# Patient Record
Sex: Female | Born: 1965 | Race: White | Hispanic: No | Marital: Single | Smoking: Never smoker
Health system: Southern US, Community
[De-identification: ages and names within clinical notes are randomized; demographics above are authoritative.]

## PROBLEM LIST (undated history)

## (undated) DIAGNOSIS — C50919 Malignant neoplasm of unspecified site of unspecified female breast: Secondary | ICD-10-CM

## (undated) DIAGNOSIS — D649 Anemia, unspecified: Secondary | ICD-10-CM

## (undated) DIAGNOSIS — T7840XA Allergy, unspecified, initial encounter: Secondary | ICD-10-CM

## (undated) DIAGNOSIS — S41119A Laceration without foreign body of unspecified upper arm, initial encounter: Secondary | ICD-10-CM

## (undated) HISTORY — DX: Allergy, unspecified, initial encounter: T78.40XA

## (undated) HISTORY — DX: Anemia, unspecified: D64.9

## (undated) HISTORY — PX: BREAST LUMPECTOMY: SHX2

## (undated) HISTORY — DX: Laceration without foreign body of unspecified upper arm, initial encounter: S41.119A

## (undated) HISTORY — DX: Malignant neoplasm of unspecified site of unspecified female breast: C50.919

---

## 1997-12-19 ENCOUNTER — Other Ambulatory Visit: Admission: RE | Admit: 1997-12-19 | Discharge: 1997-12-19 | Payer: Self-pay | Admitting: Obstetrics and Gynecology

## 2001-11-09 ENCOUNTER — Other Ambulatory Visit: Admission: RE | Admit: 2001-11-09 | Discharge: 2001-11-09 | Payer: Self-pay | Admitting: Obstetrics and Gynecology

## 2006-05-25 HISTORY — PX: WISDOM TOOTH EXTRACTION: SHX21

## 2007-11-21 ENCOUNTER — Other Ambulatory Visit: Admission: RE | Admit: 2007-11-21 | Discharge: 2007-11-21 | Payer: Self-pay | Admitting: Obstetrics and Gynecology

## 2013-11-17 ENCOUNTER — Encounter: Payer: Self-pay | Admitting: Certified Nurse Midwife

## 2013-11-20 ENCOUNTER — Encounter: Payer: Self-pay | Admitting: Certified Nurse Midwife

## 2013-11-20 ENCOUNTER — Ambulatory Visit (INDEPENDENT_AMBULATORY_CARE_PROVIDER_SITE_OTHER): Payer: BC Managed Care – PPO | Admitting: Certified Nurse Midwife

## 2013-11-20 VITALS — BP 120/78 | HR 68 | Resp 16 | Ht 65.25 in | Wt 134.0 lb

## 2013-11-20 DIAGNOSIS — Z01419 Encounter for gynecological examination (general) (routine) without abnormal findings: Secondary | ICD-10-CM

## 2013-11-20 DIAGNOSIS — D509 Iron deficiency anemia, unspecified: Secondary | ICD-10-CM

## 2013-11-20 DIAGNOSIS — D649 Anemia, unspecified: Secondary | ICD-10-CM

## 2013-11-20 DIAGNOSIS — Z0000 Encounter for general adult medical examination without abnormal findings: Secondary | ICD-10-CM

## 2013-11-20 DIAGNOSIS — Z124 Encounter for screening for malignant neoplasm of cervix: Secondary | ICD-10-CM

## 2013-11-20 DIAGNOSIS — N852 Hypertrophy of uterus: Secondary | ICD-10-CM

## 2013-11-20 LAB — POCT URINALYSIS DIPSTICK
Bilirubin, UA: NEGATIVE
GLUCOSE UA: NEGATIVE
KETONES UA: NEGATIVE
Leukocytes, UA: NEGATIVE
Nitrite, UA: NEGATIVE
Protein, UA: NEGATIVE
RBC UA: NEGATIVE
Urobilinogen, UA: NEGATIVE
pH, UA: 5

## 2013-11-20 NOTE — Patient Instructions (Addendum)
EXERCISE AND DIET:  We recommended that you start or continue a regular exercise program for good health. Regular exercise means any activity that makes your heart beat faster and makes you sweat.  We recommend exercising at least 30 minutes per day at least 3 days a week, preferably 4 or 5.  We also recommend a diet low in fat and sugar.  Inactivity, poor dietary choices and obesity can cause diabetes, heart attack, stroke, and kidney damage, among others.    ALCOHOL AND SMOKING:  Women should limit their alcohol intake to no more than 7 drinks/beers/glasses of wine (combined, not each!) per week. Moderation of alcohol intake to this level decreases your risk of breast cancer and liver damage. And of course, no recreational drugs are part of a healthy lifestyle.  And absolutely no smoking or even second hand smoke. Most people know smoking can cause heart and lung diseases, but did you know it also contributes to weakening of your bones? Aging of your skin?  Yellowing of your teeth and nails?  CALCIUM AND VITAMIN D:  Adequate intake of calcium and Vitamin D are recommended.  The recommendations for exact amounts of these supplements seem to change often, but generally speaking 600 mg of calcium (either carbonate or citrate) and 800 units of Vitamin D per day seems prudent. Certain women may benefit from higher intake of Vitamin D.  If you are among these women, your doctor will have told you during your visit.    PAP SMEARS:  Pap smears, to check for cervical cancer or precancers,  have traditionally been done yearly, although recent scientific advances have shown that most women can have pap smears less often.  However, every woman still should have a physical exam from her gynecologist every year. It will include a breast check, inspection of the vulva and vagina to check for abnormal growths or skin changes, a visual exam of the cervix, and then an exam to evaluate the size and shape of the uterus and  ovaries.  And after 47 years of age, a rectal exam is indicated to check for rectal cancers. We will also provide age appropriate advice regarding health maintenance, like when you should have certain vaccines, screening for sexually transmitted diseases, bone density testing, colonoscopy, mammograms, etc.   MAMMOGRAMS:  All women over 40 years old should have a yearly mammogram. Many facilities now offer a "3D" mammogram, which may cost around $50 extra out of pocket. If possible,  we recommend you accept the option to have the 3D mammogram performed.  It both reduces the number of women who will be called back for extra views which then turn out to be normal, and it is better than the routine mammogram at detecting truly abnormal areas.    COLONOSCOPY:  Colonoscopy to screen for colon cancer is recommended for all women at age 50.  We know, you hate the idea of the prep.  We agree, BUT, having colon cancer and not knowing it is worse!!  Colon cancer so often starts as a polyp that can be seen and removed at colonscopy, which can quite literally save your life!  And if your first colonoscopy is normal and you have no family history of colon cancer, most women don't have to have it again for 10 years.  Once every ten years, you can do something that may end up saving your life, right?  We will be happy to help you get it scheduled when you are ready.    Be sure to check your insurance coverage so you understand how much it will cost.  It may be covered as a preventative service at no cost, but you should check your particular policyIron Deficiency Anemia Anemia is a condition in which there are less red blood cells or hemoglobin in the blood than normal. Hemoglobin is the part of red blood cells that carries oxygen. Iron deficiency anemia is anemia caused by too little iron. It is the most common type of anemia. It may leave you tired and short of breath. CAUSES   Lack of iron in the diet.  Poor absorption  of iron, as seen with intestinal disorders.  Intestinal bleeding.  Heavy periods. SIGNS AND SYMPTOMS  Mild anemia may not be noticeable. Symptoms may include:  Fatigue.  Headache.  Pale skin.  Weakness.  Tiredness.  Shortness of breath.  Dizziness.  Cold hands and feet.  Fast or irregular heartbeat. DIAGNOSIS  Diagnosis requires a thorough evaluation and physical exam by your health care provider. Blood tests are generally used to confirm iron deficiency anemia. Additional tests may be done to find the underlying cause of your anemia. These may include:  Testing for blood in the stool (fecal occult blood test).  A procedure to see inside the colon and rectum (colonoscopy).  A procedure to see inside the esophagus and stomach (endoscopy). TREATMENT  Iron deficiency anemia is treated by correcting the cause of the deficiency. Treatment may involve:  Adding iron-rich foods to your diet.  Taking iron supplements. Pregnant or breastfeeding women need to take extra iron because their normal diet usually does not provide the required amount.  Taking vitamins. Vitamin C improves the absorption of iron. Your health care provider may recommend that you take your iron tablets with a glass of orange juice or vitamin C supplement.  Medicines to make heavy menstrual flow lighter.  Surgery. HOME CARE INSTRUCTIONS   Take iron as directed by your health care provider.  If you cannot tolerate taking iron supplements by mouth, talk to your health care provider about taking them through a vein (intravenously) or an injection into a muscle.  For the best iron absorption, iron supplements should be taken on an empty stomach. If you cannot tolerate them on an empty stomach, you may need to take them with food.  Do not drink milk or take antacids at the same time as your iron supplements. Milk and antacids may interfere with the absorption of iron.  Iron supplements can cause  constipation. Make sure to include fiber in your diet to prevent constipation. A stool softener may also be recommended.  Take vitamins as directed by your health care provider.  Eat a diet rich in iron. Foods high in iron include liver, lean beef, whole-grain bread, eggs, dried fruit, and dark green leafy vegetables. SEEK IMMEDIATE MEDICAL CARE IF:   You faint. If this happens, do not drive. Call your local emergency services (911 in [place] if no other help is available.  You have chest pain.  You feel nauseous or vomit.  You have severe or increased shortness of breath with activity.  You feel weak.  You have a rapid heartbeat.  You have unexplained sweating.  You become light-headed when getting up from a chair or bed. MAKE SURE YOU:   Understand these instructions.  Will watch your condition.  Will get help right away if you are not doing well or get worse. Document Released: 05/08/2000 Document Revised: 05/16/2013 Document Reviewed: 01/16/2013 ExitCare   05/16/2013 Document Reviewed: 01/16/2013 [facility] Patient Information 2015 [place], [place]. This information is not intended to replace advice given to you by your health care provider. Make sure you discuss any questions you have with your health care provider.

## 2013-11-20 NOTE — Progress Notes (Signed)
Reviewed personally.  M. Suzanne Miller, MD.  

## 2013-11-20 NOTE — Progress Notes (Signed)
48 y.o. G0P0000 Single Caucasian Fe here for annual exam.  Periods normal, but every 2 weeks the past month, now spotting today. Not sexually active. Denies any hot flashes or night sweats other cycle change. Patient has history of fibroids. Does not see PCP on regular basis. Patient has noticed a skin change on her nose, would like checked also. No other health issues. 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  Patient's last menstrual period was 10/23/2013.          Sexually active: No.  The current method of family planning is abstinence.    Exercising: Yes.    walking Smoker:  no  Health Maintenance: Pap:  01-11-12 neg HPV HR neg MMG: 8/14 normal per patient Colonoscopy:  none BMD:   none TDaP: 2009 Labs: Poct urine-neg, Hgb-11.4 Self breast exam: done occ   reports that she has never smoked. She does not have any smokeless tobacco history on file. She reports that she does not drink alcohol or use illicit drugs.  Past Medical History  Diagnosis Date  . Arm laceration     3rd grade    Past Surgical History  Procedure Laterality Date  . Wisdom tooth extraction  2008    Current Outpatient Prescriptions  Medication Sig Dispense Refill  . ibuprofen (ADVIL,MOTRIN) 200 MG tablet Take 200 mg by mouth as needed.      . Naproxen Sodium (ALEVE) 220 MG CAPS Take by mouth as needed.       No current facility-administered medications for this visit.    Family History  Problem Relation Age of Onset  . Hypertension Mother 30  . Cancer Mother 37    uterine cancer & lung cancer  . Hypertension Father     47s  . Diabetes Maternal Grandmother     ROS:  Pertinent items are noted in HPI.  Otherwise, a comprehensive ROS was negative.  Exam:   BP 120/78  Pulse 68  Resp 16  Ht 5' 5.25" (1.657 m)  Wt 134 lb (60.782 kg)  BMI 22.14 kg/m2  LMP 10/23/2013 Height: 5' 5.25" (165.7 cm)  Ht Readings from Last 3 Encounters:  11/20/13 5' 5.25" (1.657 m)    General appearance: alert,  cooperative and appears stated age Head: Normocephalic, without obvious abnormality, atraumatic Neck: no adenopathy, supple, symmetrical, trachea midline and thyroid normal to inspection and palpation and non-palpable Lungs: clear to auscultation bilaterally Breasts: normal appearance, no masses or tenderness, No nipple retraction or dimpling, No nipple discharge or bleeding, No axillary or supraclavicular adenopathy Heart: regular rate and rhythm Abdomen: soft, non-tender; no masses,  no organomegaly Extremities: extremities normal, atraumatic, no cyanosis or edema Skin: Skin color, texture, turgor normal. No rashes or lesions Nose: right side rough texture lesion with color change noted Lymph nodes: Cervical, supraclavicular, and axillary nodes normal. No abnormal inguinal nodes palpated Neurologic: Grossly normal   Pelvic: External genitalia:  no lesions              Urethra:  normal appearing urethra with no masses, tenderness or lesions              Bartholin's and Skene's: normal                 Vagina: normal appearing vagina with normal color and discharge, no lesions              Cervix: very posterior, non tender, normal  Pap taken: Yes.   Bimanual Exam:  Uterus:  enlarged, 12 weeks size and regular contour history of fibroids, size change              Adnexa: normal adnexa and no mass, fullness, tenderness               Rectovaginal: Confirms               Anus:  normal sphincter tone, no lesions  A:  Well Woman with normal exam  Not sexually active  Enlarged uterus with size change history of fibroids with DUB for past 2 months  Borderline anemia ? Related to cycle change  Facial skin change on right side of nose  P:   Reviewed health and wellness pertinent to exam   Discussed uterine size change and possible effect on cycle change. Discussed need for PUS to assess. Patient agreeable. Recruitment consultant will precert and patient will be advised of information  and PUS will be scheduled. Order placed.  Discussed Hgb. Changed ? Related to cycle frequency and importance of iron foods in diet. Given menses calendar to record and advise.  Lab:CBC, Iron,Ferritin IBC, TSH, Vitamin D  Patient has a dermatologist and will schedule appointment. Discussed feel this needs to be evaluated.  Pap smear taken today with reflex   counseled on breast self exam, mammography screening, adequate intake of calcium and vitamin D, diet and exercise  return annually or prn  An After Visit Summary was printed and given to the patient.

## 2013-11-21 ENCOUNTER — Telehealth: Payer: Self-pay

## 2013-11-21 LAB — CBC
HCT: 35.4 % — ABNORMAL LOW (ref 36.0–46.0)
Hemoglobin: 11.5 g/dL — ABNORMAL LOW (ref 12.0–15.0)
MCH: 25.8 pg — ABNORMAL LOW (ref 26.0–34.0)
MCHC: 32.5 g/dL (ref 30.0–36.0)
MCV: 79.6 fL (ref 78.0–100.0)
PLATELETS: 371 10*3/uL (ref 150–400)
RBC: 4.45 MIL/uL (ref 3.87–5.11)
RDW: 14.1 % (ref 11.5–15.5)
WBC: 7.9 10*3/uL (ref 4.0–10.5)

## 2013-11-21 LAB — IBC PANEL
%SAT: 11 % — AB (ref 20–55)
TIBC: 369 ug/dL (ref 250–470)
UIBC: 328 ug/dL (ref 125–400)

## 2013-11-21 LAB — VITAMIN D 25 HYDROXY (VIT D DEFICIENCY, FRACTURES): VIT D 25 HYDROXY: 34 ng/mL (ref 30–89)

## 2013-11-21 LAB — TSH: TSH: 1.197 u[IU]/mL (ref 0.350–4.500)

## 2013-11-21 LAB — HEMOGLOBIN, FINGERSTICK: HEMOGLOBIN, FINGERSTICK: 11.4 g/dL — AB (ref 12.0–16.0)

## 2013-11-21 LAB — IRON: IRON: 41 ug/dL — AB (ref 42–145)

## 2013-11-21 LAB — FERRITIN: FERRITIN: 4 ng/mL — AB (ref 10–291)

## 2013-11-21 MED ORDER — FUSION PLUS PO CAPS: 1.0000 | ORAL_CAPSULE | Freq: Every day | ORAL | Status: DC

## 2013-11-21 NOTE — Telephone Encounter (Signed)
lmtcb

## 2013-11-21 NOTE — Addendum Note (Signed)
Addended by: Regina Eck on: 11/21/2013 08:39 AM   Modules accepted: Orders

## 2013-11-21 NOTE — Telephone Encounter (Signed)
Patient notified of results. See lab 

## 2013-11-23 LAB — IPS PAP TEST WITH REFLEX TO HPV

## 2013-11-28 ENCOUNTER — Telehealth: Payer: Self-pay | Admitting: Gynecology

## 2013-11-28 NOTE — Telephone Encounter (Signed)
Left message for patient to call back. Need to go over benefits and scheduled PUS

## 2013-11-29 NOTE — Telephone Encounter (Signed)
Spoke with patient. Advised that per benefit quote received, she will be responsible for $35 copay for PUS. Patient agreeable.  Scheduled PUS. Advised patient of 72 hour cancellation policy and $629 cancellation fee. Patient agreeable.

## 2013-12-03 ENCOUNTER — Encounter: Payer: Self-pay | Admitting: Certified Nurse Midwife

## 2013-12-04 LAB — HM PAP SMEAR

## 2013-12-05 ENCOUNTER — Other Ambulatory Visit: Payer: Self-pay | Admitting: *Deleted

## 2013-12-05 ENCOUNTER — Encounter: Payer: Self-pay | Admitting: Gynecology

## 2013-12-05 ENCOUNTER — Ambulatory Visit (INDEPENDENT_AMBULATORY_CARE_PROVIDER_SITE_OTHER): Payer: BC Managed Care – PPO

## 2013-12-05 ENCOUNTER — Other Ambulatory Visit: Payer: Self-pay | Admitting: Gynecology

## 2013-12-05 ENCOUNTER — Ambulatory Visit (INDEPENDENT_AMBULATORY_CARE_PROVIDER_SITE_OTHER): Payer: BC Managed Care – PPO | Admitting: Gynecology

## 2013-12-05 ENCOUNTER — Telehealth: Payer: Self-pay | Admitting: *Deleted

## 2013-12-05 DIAGNOSIS — N938 Other specified abnormal uterine and vaginal bleeding: Secondary | ICD-10-CM

## 2013-12-05 DIAGNOSIS — N925 Other specified irregular menstruation: Secondary | ICD-10-CM

## 2013-12-05 DIAGNOSIS — N949 Unspecified condition associated with female genital organs and menstrual cycle: Secondary | ICD-10-CM

## 2013-12-05 DIAGNOSIS — N852 Hypertrophy of uterus: Secondary | ICD-10-CM

## 2013-12-05 DIAGNOSIS — N921 Excessive and frequent menstruation with irregular cycle: Secondary | ICD-10-CM

## 2013-12-05 DIAGNOSIS — D259 Leiomyoma of uterus, unspecified: Secondary | ICD-10-CM

## 2013-12-05 NOTE — Patient Instructions (Signed)
D&C hysteroscopy Submucosal fibroid Endometrial polyp  [email or web address]

## 2013-12-05 NOTE — Progress Notes (Signed)
      Pt here for evaluation of known fibroid uterus and DUB.   Images were compared with 2010 u/s Fibroids with minimal change however irregular endometrium noted. Pt informed, recommend SHG. Consent obtained. Speculum placed after lidocaine jelly applied to vagina.  Cervix cleaned with betadine, insemination catheter passed with ease. Walls gently distended with NS, 2  defects were noted, 10, 6mm. Images and SHG findings reviewed with pt. Discussed the affect of submucosal fibroids vs endometrial polyps on DUB. Recommend evaluation in OR, D&C hysteroscopy. Procedure outlined to pt.  Risks of bleeding, infection, uterine perforation discussed.  Pt aware that perforation may require laparoscopy to assess and treat.  She will have cytotec pre-op to decrease this risk.  Distending media NS monitoring intra-op, risks of DVT/PE reviewed.  Post-operative course as well as signs to be wary of reviewed. Questions addressed, pt agreeable to surgery. Pt aware only intracavity lesions will be addressed in OR and no plan to remove other fibroids. Pt is going out of town and then back to work and asks if can be done soon.  LMP 11/23/2013 General appearance: alert, cooperative and appears stated age Lungs: clear to auscultation bilaterally Heart: regular rate and rhythm, S1, S2 normal, no murmur, click, rub or gallop Abdomen: soft, non-tender; bowel sounds normal; no masses,  no organomegaly Pelvic: cervix normal in appearance, external genitalia normal, no cervical motion tenderness, vagina normal without discharge and uterus enlarged and irregular, ovaries not palpable Extremities: extremities normal, atraumatic, no cyanosis or edema Lymph nodes: Inguinal adenopathy: absent  In total 44m spent counseling re bleeding and surgery, >50%face to face

## 2013-12-05 NOTE — Telephone Encounter (Signed)
Returning a call to Sally. °

## 2013-12-05 NOTE — Telephone Encounter (Signed)
Spoke with patient. Advised that per benefit quote received, she will be responsible for $563.27 for the surgeon portion of her surgery. Advised that payment is due prior to the surgery. Patient agreeable. Advised patient that Gay Filler would contact her regarding scheduling.

## 2013-12-05 NOTE — Telephone Encounter (Signed)
Call to patient to discuss surgery schedule options since patient is traveling 12-24-13 to 01-07-14 and returning to work on 01-08-14. LMTCB.

## 2013-12-06 ENCOUNTER — Encounter (HOSPITAL_COMMUNITY): Payer: Self-pay | Admitting: *Deleted

## 2013-12-06 ENCOUNTER — Encounter (HOSPITAL_COMMUNITY): Payer: Self-pay | Admitting: Pharmacy Technician

## 2013-12-06 MED ORDER — MISOPROSTOL 200 MCG PO TABS: ORAL_TABLET | ORAL | Status: DC

## 2013-12-06 NOTE — Telephone Encounter (Signed)
Surgery instructions reviewed with patient and will mail copy. Surgery is scheduled for 12-11-13 at 1100 at [facility] has already contacted her as well. Post op appointment scheduled. Call prn.  Routing to provider for final review. Patient agreeable to disposition. Will close encounter

## 2013-12-06 NOTE — Telephone Encounter (Signed)
Call to patient to review surgery instructions, LMTCB.

## 2013-12-06 NOTE — Telephone Encounter (Signed)
Pt returning call

## 2013-12-07 ENCOUNTER — Telehealth: Payer: Self-pay | Admitting: Gynecology

## 2013-12-07 NOTE — Telephone Encounter (Signed)
Spoke with patient. Collected surgery payment of [phone]. Mailed copy of receipt to patient.

## 2013-12-11 ENCOUNTER — Encounter (HOSPITAL_COMMUNITY): Payer: BC Managed Care – PPO | Admitting: Anesthesiology

## 2013-12-11 ENCOUNTER — Encounter (HOSPITAL_COMMUNITY): Payer: Self-pay

## 2013-12-11 ENCOUNTER — Ambulatory Visit (HOSPITAL_COMMUNITY): Payer: BC Managed Care – PPO | Admitting: Anesthesiology

## 2013-12-11 ENCOUNTER — Encounter (HOSPITAL_COMMUNITY)
Admission: RE | Disposition: A | Discharge status: Home / Self Care | Payer: Self-pay | Source: Ambulatory Visit | Attending: Gynecology

## 2013-12-11 ENCOUNTER — Ambulatory Visit (HOSPITAL_COMMUNITY)
Admission: RE | Admit: 2013-12-11 | Discharge: 2013-12-11 | Disposition: A | Discharge status: Home / Self Care | Payer: BC Managed Care – PPO | Source: Ambulatory Visit | Attending: Gynecology | Admitting: Gynecology

## 2013-12-11 ENCOUNTER — Telehealth: Payer: Self-pay

## 2013-12-11 DIAGNOSIS — D259 Leiomyoma of uterus, unspecified: Secondary | ICD-10-CM | POA: Insufficient documentation

## 2013-12-11 DIAGNOSIS — N840 Polyp of corpus uteri: Secondary | ICD-10-CM | POA: Insufficient documentation

## 2013-12-11 DIAGNOSIS — Z9889 Other specified postprocedural states: Secondary | ICD-10-CM

## 2013-12-11 DIAGNOSIS — N852 Hypertrophy of uterus: Secondary | ICD-10-CM | POA: Insufficient documentation

## 2013-12-11 DIAGNOSIS — N938 Other specified abnormal uterine and vaginal bleeding: Secondary | ICD-10-CM | POA: Insufficient documentation

## 2013-12-11 DIAGNOSIS — N926 Irregular menstruation, unspecified: Secondary | ICD-10-CM | POA: Insufficient documentation

## 2013-12-11 DIAGNOSIS — N949 Unspecified condition associated with female genital organs and menstrual cycle: Secondary | ICD-10-CM | POA: Insufficient documentation

## 2013-12-11 DIAGNOSIS — D250 Submucous leiomyoma of uterus: Secondary | ICD-10-CM

## 2013-12-11 HISTORY — PX: DILATATION & CURETTAGE/HYSTEROSCOPY WITH TRUECLEAR: SHX6353

## 2013-12-11 LAB — PREGNANCY, URINE: PREG TEST UR: NEGATIVE

## 2013-12-11 SURGERY — DILATATION & CURETTAGE/HYSTEROSCOPY WITH TRUCLEAR
Anesthesia: General | Site: Vagina

## 2013-12-11 MED ORDER — LIDOCAINE HCL 2 % IJ SOLN
INTRAMUSCULAR | Status: AC
  Filled 2013-12-11: qty 20

## 2013-12-11 MED ORDER — FENTANYL CITRATE 0.05 MG/ML IJ SOLN
25.0000 ug | INTRAMUSCULAR | Status: DC | PRN
  Administered 2013-12-11 (×2): 50 ug via INTRAVENOUS

## 2013-12-11 MED ORDER — BUPIVACAINE-EPINEPHRINE 0.25% -1:200000 IJ SOLN
INTRAMUSCULAR | Status: DC | PRN
  Administered 2013-12-11: 10 mL

## 2013-12-11 MED ORDER — KETOROLAC TROMETHAMINE 30 MG/ML IJ SOLN
INTRAMUSCULAR | Status: DC | PRN
  Administered 2013-12-11: 30 mg via INTRAVENOUS

## 2013-12-11 MED ORDER — MEPERIDINE HCL 25 MG/ML IJ SOLN
INTRAMUSCULAR | Status: AC
  Filled 2013-12-11: qty 1

## 2013-12-11 MED ORDER — SODIUM CHLORIDE 0.9 % IR SOLN
Status: DC | PRN
Start: 2013-12-11 — End: 2013-12-11
  Administered 2013-12-11: 3000 mL

## 2013-12-11 MED ORDER — MEPERIDINE HCL 25 MG/ML IJ SOLN
6.2500 mg | INTRAMUSCULAR | Status: DC | PRN
  Administered 2013-12-11: 12.5 mg via INTRAVENOUS

## 2013-12-11 MED ORDER — MIDAZOLAM HCL 2 MG/2ML IJ SOLN
INTRAMUSCULAR | Status: DC | PRN
  Administered 2013-12-11: 2 mg via INTRAVENOUS

## 2013-12-11 MED ORDER — KETOROLAC TROMETHAMINE 30 MG/ML IJ SOLN: 15.0000 mg | Freq: Once | INTRAMUSCULAR | Status: DC | PRN

## 2013-12-11 MED ORDER — PROPOFOL 10 MG/ML IV BOLUS
INTRAVENOUS | Status: DC | PRN
  Administered 2013-12-11: 150 mg via INTRAVENOUS

## 2013-12-11 MED ORDER — FENTANYL CITRATE 0.05 MG/ML IJ SOLN
INTRAMUSCULAR | Status: AC
  Filled 2013-12-11: qty 2

## 2013-12-11 MED ORDER — BUPIVACAINE-EPINEPHRINE (PF) 0.25% -1:200000 IJ SOLN
INTRAMUSCULAR | Status: AC
  Filled 2013-12-11: qty 30

## 2013-12-11 MED ORDER — LIDOCAINE HCL 2 % IJ SOLN
INTRAMUSCULAR | Status: DC | PRN
  Administered 2013-12-11: 10 mL

## 2013-12-11 MED ORDER — METOCLOPRAMIDE HCL 5 MG/ML IJ SOLN: 10.0000 mg | Freq: Once | INTRAMUSCULAR | Status: DC | PRN

## 2013-12-11 MED ORDER — LIDOCAINE HCL (CARDIAC) 20 MG/ML IV SOLN
INTRAVENOUS | Status: DC | PRN
  Administered 2013-12-11: 40 mg via INTRAVENOUS

## 2013-12-11 MED ORDER — DEXAMETHASONE SODIUM PHOSPHATE 10 MG/ML IJ SOLN
INTRAMUSCULAR | Status: DC | PRN
  Administered 2013-12-11: 10 mg via INTRAVENOUS

## 2013-12-11 MED ORDER — LACTATED RINGERS IV SOLN
INTRAVENOUS | Status: DC
  Administered 2013-12-11 (×3): via INTRAVENOUS

## 2013-12-11 MED ORDER — FENTANYL CITRATE 0.05 MG/ML IJ SOLN
INTRAMUSCULAR | Status: DC | PRN
  Administered 2013-12-11: 100 ug via INTRAVENOUS

## 2013-12-11 MED ORDER — KETOROLAC TROMETHAMINE 30 MG/ML IJ SOLN
INTRAMUSCULAR | Status: AC
  Filled 2013-12-11: qty 1

## 2013-12-11 MED ORDER — ONDANSETRON HCL 4 MG/2ML IJ SOLN
INTRAMUSCULAR | Status: DC | PRN
  Administered 2013-12-11: 4 mg via INTRAVENOUS

## 2013-12-11 MED ORDER — MIDAZOLAM HCL 2 MG/2ML IJ SOLN
INTRAMUSCULAR | Status: AC
  Filled 2013-12-11: qty 2

## 2013-12-11 SURGICAL SUPPLY — 16 items
BLADE INCISOR TRUC PLUS 2.9 (ABLATOR) IMPLANT
CANISTERS HI-FLOW 3000CC (CANNISTER) ×8 IMPLANT
CATH ROBINSON RED A/P 16FR (CATHETERS) ×3 IMPLANT
CONTAINER PREFILL 10% NBF 60ML (FORM) ×4 IMPLANT
DRAPE HYSTEROSCOPY (DRAPE) ×2 IMPLANT
GLOVE BIOGEL M 6.5 STRL (GLOVE) ×3 IMPLANT
GLOVE BIOGEL PI IND STRL 6.5 (GLOVE) ×1 IMPLANT
GLOVE BIOGEL PI INDICATOR 6.5 (GLOVE) ×2
GOWN STRL REUS W/TWL LRG LVL3 (GOWN DISPOSABLE) ×10 IMPLANT
INCISOR TRUC PLUS BLADE 2.9 (ABLATOR)
KIT HYSTEROSCOPY TRUCLEAR (ABLATOR) ×2 IMPLANT
MORCELLATOR RECIP TRUCLEAR 4.0 (ABLATOR) ×2 IMPLANT
PACK VAGINAL MINOR WOMEN LF (CUSTOM PROCEDURE TRAY) ×3 IMPLANT
PAD OB MATERNITY 4.3X12.25 (PERSONAL CARE ITEMS) ×3 IMPLANT
TOWEL OR 17X24 6PK STRL BLUE (TOWEL DISPOSABLE) ×6 IMPLANT
WATER STERILE IRR 1000ML POUR (IV SOLUTION) ×3 IMPLANT

## 2013-12-11 NOTE — Anesthesia Postprocedure Evaluation (Signed)
  Anesthesia Post-op Note  Anesthesia Post Note  Patient: Betty Huber  Procedure(s) Performed: Procedure(s) (LRB): DILATATION & CURETTAGE/HYSTEROSCOPY WITH TRUCLEAR (N/A)  Anesthesia type: General  Patient location: PACU  Post pain: Pain level controlled  Post assessment: Post-op Vital signs reviewed  Last Vitals:  Filed Vitals:   12/11/13 1300  BP: 129/71  Pulse: 77  Temp:   Resp: 19    Post vital signs: Reviewed  Level of consciousness: sedated  Complications: No apparent anesthesia complications

## 2013-12-11 NOTE — Discharge Instructions (Addendum)
DISCHARGE INSTRUCTIONS: D&C / D&E The following instructions have been prepared to help you care for yourself upon your return home.   Personal hygiene:  Use sanitary pads for vaginal drainage, not tampons.  Shower the day after your procedure.  NO tub baths, pools or Jacuzzis for 2-3 weeks.  Wipe front to back after using the bathroom.  Activity and limitations:  Do NOT drive or operate any equipment for 24 hours. The effects of anesthesia are still present and drowsiness may result.  Do NOT rest in bed all day.  Walking is encouraged.  Walk up and down stairs slowly.  You may resume your normal activity in one to two days or as indicated by your physician.  Sexual activity: NO intercourse for at least 2 weeks after the procedure, or as indicated by your physician.  Diet: Eat a light meal as desired this evening. You may resume your usual diet tomorrow.  Return to work: You may resume your work activities in one to two days or as indicated by your doctor.  What to expect after your surgery: Expect to have vaginal bleeding/discharge for 2-3 days and spotting for up to 10 days. It is not unusual to have soreness for up to 1-2 weeks. You may have a slight burning sensation when you urinate for the first day. Mild cramps may continue for a couple of days. You may have a regular period in 2-6 weeks.  Call your doctor for any of the following:  Excessive vaginal bleeding, saturating and changing one pad every hour.  Inability to urinate 6 hours after discharge from hospital.  Pain not relieved by pain medication.  Fever of 100.4 F or greater.  Unusual vaginal discharge or odor.   Call for an appointment:    Patients signature: ______________________  Nurses signature ________________________  Support person's signature_______________________     DO NOT TAKE ANY IBUPROFEN PRODUCTS UNTIL 6 PM TODAY.  YOU RECEIVED A SIMILAR MEDICATION IN THE OPERATING ROOM AT 1155  AM.

## 2013-12-11 NOTE — Interval H&P Note (Signed)
History and Physical Interval Note:  12/11/2013 10:11 AM  Betty Huber  has presented today for surgery, with the diagnosis of endometrial mass, enlarged uterus, DUB  The various methods of treatment have been discussed with the patient and family. After consideration of risks, benefits and other options for treatment, the patient has consented to  Procedure(s): [place] (N/A) as a surgical intervention .  The patient's history has been reviewed, patient examined, no change in status, stable for surgery.  I have reviewed the patient's chart and labs.  Questions were answered to the patient's satisfaction.     Caydin Yeatts H

## 2013-12-11 NOTE — H&P (View-Only) (Signed)
      Pt here for evaluation of known fibroid uterus and DUB.   Images were compared with 2010 u/s Fibroids with minimal change however irregular endometrium noted. Pt informed, recommend SHG. Consent obtained. Speculum placed after lidocaine jelly applied to vagina.  Cervix cleaned with betadine, insemination catheter passed with ease. Walls gently distended with NS, 2  defects were noted, 10, 65mm. Images and SHG findings reviewed with pt. Discussed the affect of submucosal fibroids vs endometrial polyps on DUB. Recommend evaluation in OR, D&C hysteroscopy. Procedure outlined to pt.  Risks of bleeding, infection, uterine perforation discussed.  Pt aware that perforation may require laparoscopy to assess and treat.  She will have cytotec pre-op to decrease this risk.  Distending media NS monitoring intra-op, risks of DVT/PE reviewed.  Post-operative course as well as signs to be wary of reviewed. Questions addressed, pt agreeable to surgery. Pt aware only intracavity lesions will be addressed in OR and no plan to remove other fibroids. Pt is going out of town and then back to work and asks if can be done soon.  LMP 11/23/2013 General appearance: alert, cooperative and appears stated age Lungs: clear to auscultation bilaterally Heart: regular rate and rhythm, S1, S2 normal, no murmur, click, rub or gallop Abdomen: soft, non-tender; bowel sounds normal; no masses,  no organomegaly Pelvic: cervix normal in appearance, external genitalia normal, no cervical motion tenderness, vagina normal without discharge and uterus enlarged and irregular, ovaries not palpable Extremities: extremities normal, atraumatic, no cyanosis or edema Lymph nodes: Inguinal adenopathy: absent  In total 37m spent counseling re bleeding and surgery, >50%face to face

## 2013-12-11 NOTE — Anesthesia Preprocedure Evaluation (Addendum)
Anesthesia Evaluation  Patient identified by MRN, date of birth, ID band Patient awake    Reviewed: Allergy & Precautions, H&P , NPO status , Patient's Chart, lab work & pertinent test results, reviewed documented beta blocker date and time   Airway Mallampati: II TM Distance: >3 FB Neck ROM: full    Dental  (+) Teeth Intact   Pulmonary neg pulmonary ROS,  breath sounds clear to auscultation  Pulmonary exam normal       Cardiovascular negative cardio ROS  Rhythm:regular Rate:Normal     Neuro/Psych negative neurological ROS  negative psych ROS   GI/Hepatic negative GI ROS, Neg liver ROS,   Endo/Other  negative endocrine ROS  Renal/GU negative Renal ROS  Female GU complaint     Musculoskeletal   Abdominal   Peds  Hematology negative hematology ROS (+)   Anesthesia Other Findings   Reproductive/Obstetrics negative OB ROS                          Anesthesia Physical Anesthesia Plan  ASA: I  Anesthesia Plan: General LMA   Post-op Pain Management:    Induction:   Airway Management Planned:   Additional Equipment:   Intra-op Plan:   Post-operative Plan:   Informed Consent: I have reviewed the patients History and Physical, chart, labs and discussed the procedure including the risks, benefits and alternatives for the proposed anesthesia with the patient or authorized representative who has indicated his/her understanding and acceptance.   Dental Advisory Given  Plan Discussed with: CRNA and Surgeon  Anesthesia Plan Comments:         Anesthesia Quick Evaluation

## 2013-12-11 NOTE — Telephone Encounter (Signed)
Left message to call [place] at [phone].  Advise patient spoke with Dr.Lathrop and she states that polyps and fibroids look similar. Unable to tell at this time how many of what without pathology. Three were removed. Pathology should be back within one week for results.

## 2013-12-11 NOTE — Telephone Encounter (Signed)
Spoke with patient. Patient states that she had surgery this morning with Dr.Lathrop. Patient is calling to verify if she had a "polyp or fibroid" and would like to know was it two and both of what they thought and how long for results with the biopsy. Advised patient that I would speak with Dr.Lathrop and give patient a call back. Patient agreeable. Patient would like return call the [phone] this is her sisters number. Okay per ROI.

## 2013-12-11 NOTE — Anesthesia Procedure Notes (Signed)
Procedure Name: LMA Insertion Date/Time: 12/11/2013 11:10 AM Performed by: Jett Kulzer, Sheron Nightingale Pre-anesthesia Checklist: Patient identified, Timeout performed, Emergency Drugs available, Suction available and Patient being monitored Patient Re-evaluated:Patient Re-evaluated prior to inductionOxygen Delivery Method: Circle system utilized Preoxygenation: Pre-oxygenation with 100% oxygen Intubation Type: IV induction LMA: LMA inserted LMA Size: 4.0 Number of attempts: 1 ETT to lip (cm): at lip. Dental Injury: Teeth and Oropharynx as per pre-operative assessment

## 2013-12-11 NOTE — Brief Op Note (Signed)
12/11/2013  12:04 PM  PATIENT:  Laurice Record  48 y.o. female  PRE-OPERATIVE DIAGNOSIS:  endometrial mass, enlarged uterus, Dysfunctional Uterine Bleeding  POST-OPERATIVE DIAGNOSIS:  endometrial mass, enlarged uterus, Dysfunctional Uterine Bleeding  PROCEDURE:  Procedure(s): DILATATION & CURETTAGE/HYSTEROSCOPY WITH TRUCLEAR (N/A)  SURGEON:  Surgeon(s) and Role:    * Azalia Bilis, MD - Primary  PHYSICIAN ASSISTANT:   ASSISTANTS: none   ANESTHESIA:   general  EBL:  Total I/O In: 1000 [I.V.:1000] Out: -   BLOOD ADMINISTERED:none  DRAINS: none   LOCAL MEDICATIONS USED:  MARCAINE   , LIDOCAINE  and Amount: 20 ml  SPECIMEN:  Source of Specimen:  uterus  DISPOSITION OF SPECIMEN:  PATHOLOGY  COUNTS:  YES  TOURNIQUET:  * No tourniquets in log *  DICTATION: .Other Dictation: Dictation Number [id number]  PLAN OF CARE: Discharge to home after PACU  PATIENT DISPOSITION:  PACU - hemodynamically stable.   Delay start of Pharmacological VTE agent (>24hrs) due to surgical blood loss or risk of bleeding: not applicable

## 2013-12-11 NOTE — Transfer of Care (Signed)
Immediate Anesthesia Transfer of Care Note  Patient: Betty Huber  Procedure(s) Performed: Procedure(s): DILATATION & CURETTAGE/HYSTEROSCOPY WITH TRUCLEAR (N/A)  Patient Location: PACU  Anesthesia Type:General  Level of Consciousness: awake, alert , oriented and patient cooperative  Airway & Oxygen Therapy: Patient Spontanous Breathing and Patient connected to nasal cannula oxygen  Post-op Assessment: Report given to PACU RN and Post -op Vital signs reviewed and stable  Post vital signs: Reviewed and stable  Complications: No apparent anesthesia complications

## 2013-12-12 ENCOUNTER — Telehealth: Payer: Self-pay

## 2013-12-12 ENCOUNTER — Encounter (HOSPITAL_COMMUNITY): Payer: Self-pay | Admitting: Gynecology

## 2013-12-12 NOTE — Telephone Encounter (Signed)
Spoke with patient. Results given as seen below from Dr.Lathrop. Patient agreeable and verbalizes understanding.  Routing to provider for final review. Patient agreeable to disposition. Will close encounter

## 2013-12-12 NOTE — Telephone Encounter (Signed)
Spoke with patient. Advised of message from Dr.Lathrop as seen below. Patient is agreeable and verbalizes understanding.  Routing to provider for final review. Patient agreeable to disposition. Will close encounter

## 2013-12-12 NOTE — Telephone Encounter (Signed)
Message copied by Jasmine Awe on Tue Dec 12, 2013  3:57 PM ------      Message from: Elveria Rising      Created: Tue Dec 12, 2013  3:53 PM       Benign polyps, please inform ------

## 2013-12-12 NOTE — Op Note (Signed)
Betty Huber, Betty Huber               ACCOUNT NO.:  [id number]  MEDICAL RECORD NO.:  [id number]  LOCATION:  [facility]                          FACILITY:  [facility]  PHYSICIAN:  Alver Sorrow. Charlies Constable, M.D. DATE OF BIRTH:  1965/09/05  DATE OF PROCEDURE:  12/11/2013 DATE OF DISCHARGE:  12/11/2013                              OPERATIVE REPORT   PREOPERATIVE DIAGNOSES: 1. Fibroid uterus. 2. Irregular menses.  POSTOPERATIVE DIAGNOSES: 1. Fibroid uterus. 2. Irregular menses.  PROCEDURE:  D and C, hysteroscopy with TRUCLEAR for resection of polyps and fibroids.  SURGEON:  Alver Sorrow. Charlies Constable, M.D.  ANESTHESIA:  General.  IV FLUIDS:  1000 mL lactated Ringer's.  I and O deficit of normal saline was 120.  INDICATIONS:  The patient is a 48 year old virginal female with a multi- fibroid uterus who was found to have an increasing irregular bleeding. Her sonohysterogram performed showed at least 2 intracavitary defects. Differential was fibroid versus polyp.  FINDINGS:  Mobile, enlarged uterus sounded to 9 cm.  Multiple intracavitary defects were noted.  Appearing to both polypoid and fibroid in nature.    PATHOLOGY: uterine curretings.  PROCEDURE:  The patient was taken to the operating room.  General anesthesia was induced.  Placed in dorsal lithotomy position.  Prepped and draped in usual sterile fashion.  Bimanual exam was performed.  The orientation of the uterus was confirmed.  A Peterson speculum was placed in the vagina.  The cervix was visualized and stabilized with a single- tooth tenaculum.  The os was noted to be dilated from Cytotec taken the night before and morning of.  The uterus sounded to 9.  The cervix was noted to be dilated beyond the diagnostic hysteroscope.  The scope was advanced through the cervix and into the cavity without resistance.  The findings were as noted above.  Because 3 of these lesions appeared fibroid in nature, the cervix was then gently dilated up to 9.5  after which the obturator was advanced for the larger scope.  Placement in the cavity was confirmed.  There was some debris from the dilation.  The obturator was removed.  Sharp curettage was performed in order to clear any debris what appeared to be polypoid like material was removed.  The scope was then readvanced.  The cavity was cleared.  The 3 larger defects were noted.  The TRUCLEAR fibroid resecting blade was then advanced into the cavity.  Using the blade, the defects were removed and the contour of the cavity was unremarkable at the end of the procedure. Both tubal ostia were now able to be seen which were obstructed by the earlier pathology.  Slow removal through the cervix showed no defects or bleeding.  There was a small amount of bleeding noted from the hymen and that was treated with a single figure-of-eight of 3-0 chromic.  The patient tolerated the procedure well.  Sponge, lap, and needle counts were correct x2.  She was extubated in the OR, and transferred to recovery room in stable condition.     Alver Sorrow. Charlies Constable, M.D.     THL/MEDQ  D:  12/11/2013  T:  12/12/2013  Job:  [id number]

## 2013-12-27 ENCOUNTER — Other Ambulatory Visit (INDEPENDENT_AMBULATORY_CARE_PROVIDER_SITE_OTHER): Payer: BC Managed Care – PPO

## 2013-12-27 ENCOUNTER — Encounter: Payer: Self-pay | Admitting: Gynecology

## 2013-12-27 ENCOUNTER — Ambulatory Visit (INDEPENDENT_AMBULATORY_CARE_PROVIDER_SITE_OTHER): Payer: BC Managed Care – PPO | Admitting: Gynecology

## 2013-12-27 VITALS — BP 124/82 | Resp 18 | Ht 65.25 in | Wt 132.0 lb

## 2013-12-27 DIAGNOSIS — D500 Iron deficiency anemia secondary to blood loss (chronic): Secondary | ICD-10-CM

## 2013-12-27 DIAGNOSIS — N852 Hypertrophy of uterus: Secondary | ICD-10-CM

## 2013-12-27 DIAGNOSIS — D649 Anemia, unspecified: Secondary | ICD-10-CM | POA: Insufficient documentation

## 2013-12-27 DIAGNOSIS — D509 Iron deficiency anemia, unspecified: Secondary | ICD-10-CM

## 2013-12-27 DIAGNOSIS — N920 Excessive and frequent menstruation with regular cycle: Secondary | ICD-10-CM

## 2013-12-27 LAB — CBC
HEMATOCRIT: 38.1 % (ref 36.0–46.0)
Hemoglobin: 12.8 g/dL (ref 12.0–15.0)
MCH: 27.9 pg (ref 26.0–34.0)
MCHC: 33.6 g/dL (ref 30.0–36.0)
MCV: 83 fL (ref 78.0–100.0)
PLATELETS: 350 10*3/uL (ref 150–400)
RBC: 4.59 MIL/uL (ref 3.87–5.11)
RDW: 17 % — ABNORMAL HIGH (ref 11.5–15.5)
WBC: 4.1 10*3/uL (ref 4.0–10.5)

## 2013-12-27 NOTE — Progress Notes (Signed)
Subjective:     Patient ID: Betty Huber, female   DOB: [birth year], 48 y.o.   MRN: [id number]  HPI Comments: Pt here 2w post-op s/p D&C hysteroscopy for multiple endometrial polyps.  Pt did well after surgery, no pain, fever or bleeding.  Pt believes she may have had her cycle at bit early and it was light.      Review of Systems  Constitutional: Negative for fever and chills.  Genitourinary: Negative for vaginal bleeding, vaginal discharge and vaginal pain.       Objective:   Physical Exam  Nursing note and vitals reviewed. Constitutional: She is oriented to person, place, and time. She appears well-developed and well-nourished.  Genitourinary:   Pelvic: External genitalia:  no lesions              Urethra:  normal appearing urethra with no masses, tenderness or lesions              Bartholins and Skenes: normal                 Vagina: normal appearing vagina with normal color and discharge, no lesions, hymenal suture noted, non-tender              Cervix: normal appearance, non-tender                     Bimanual Exam:  Uterus: enlarged irrelgular,  and nontender                                       Adnexa: normal adnexa in size, nontender and no masses                                        Neurological: She is alert and oriented to person, place, and time.       Assessment:     Post-op doing well     Plan:     Reviewed operative findings and images Polyps not fibroids in cavity, affect on menses reviewed Will watch upcoming cycles Pt with anemia, iron deficient, will repeat labs today

## 2013-12-28 LAB — IRON: Iron: 67 ug/dL (ref 42–145)

## 2013-12-28 LAB — IBC PANEL
%SAT: 21 % (ref 20–55)
TIBC: 312 ug/dL (ref 250–470)
UIBC: 245 ug/dL (ref 125–400)

## 2013-12-28 LAB — FERRITIN: Ferritin: 14 ng/mL (ref 10–291)

## 2014-01-31 ENCOUNTER — Encounter: Payer: Self-pay | Admitting: Family Medicine

## 2014-01-31 ENCOUNTER — Ambulatory Visit (INDEPENDENT_AMBULATORY_CARE_PROVIDER_SITE_OTHER): Payer: BC Managed Care – PPO | Admitting: Family Medicine

## 2014-01-31 VITALS — BP 134/84 | HR 112 | Temp 98.0°F | Resp 16 | Ht 65.25 in | Wt 135.6 lb

## 2014-01-31 DIAGNOSIS — Z8349 Family history of other endocrine, nutritional and metabolic diseases: Secondary | ICD-10-CM

## 2014-01-31 DIAGNOSIS — D509 Iron deficiency anemia, unspecified: Secondary | ICD-10-CM

## 2014-01-31 DIAGNOSIS — R202 Paresthesia of skin: Secondary | ICD-10-CM | POA: Insufficient documentation

## 2014-01-31 DIAGNOSIS — R209 Unspecified disturbances of skin sensation: Secondary | ICD-10-CM

## 2014-01-31 DIAGNOSIS — Z83438 Family history of other disorder of lipoprotein metabolism and other lipidemia: Secondary | ICD-10-CM | POA: Insufficient documentation

## 2014-01-31 LAB — HEPATIC FUNCTION PANEL
ALBUMIN: 4 g/dL (ref 3.5–5.2)
ALT: 14 U/L (ref 0–35)
AST: 18 U/L (ref 0–37)
Alkaline Phosphatase: 51 U/L (ref 39–117)
BILIRUBIN TOTAL: 0.6 mg/dL (ref 0.2–1.2)
Bilirubin, Direct: 0 mg/dL (ref 0.0–0.3)
Total Protein: 7.3 g/dL (ref 6.0–8.3)

## 2014-01-31 LAB — BASIC METABOLIC PANEL
BUN: 8 mg/dL (ref 6–23)
CO2: 29 meq/L (ref 19–32)
Calcium: 9.3 mg/dL (ref 8.4–10.5)
Chloride: 103 mEq/L (ref 96–112)
Creatinine, Ser: 0.6 mg/dL (ref 0.4–1.2)
GFR: 109.55 mL/min (ref >60.00)
Glucose, Bld: 82 mg/dL (ref 70–99)
Potassium: 4.2 mEq/L (ref 3.5–5.1)
SODIUM: 138 meq/L (ref 135–145)

## 2014-01-31 LAB — B12 AND FOLATE PANEL
Folate: 17.5 ng/mL (ref >5.9)
VITAMIN B 12: 407 pg/mL (ref 211–911)

## 2014-01-31 LAB — LIPID PANEL
CHOL/HDL RATIO: 3
Cholesterol: 158 mg/dL (ref 0–200)
HDL: 56 mg/dL (ref >39.00)
LDL Cholesterol: 90 mg/dL (ref 0–99)
NONHDL: 102
Triglycerides: 58 mg/dL (ref 0.0–149.0)
VLDL: 11.6 mg/dL (ref 0.0–40.0)

## 2014-01-31 NOTE — Progress Notes (Signed)
   Subjective:    Patient ID: Betty Huber, female    DOB: 05-10-1966, 48 y.o.   MRN: [id number]  HPI New to establish.  No previous PCP.  GYN- Betty Huber.  Due for mammo upcoming, UTD on pap.  Too young for colonoscopy.  Anemia- iron deficiency anemia due to heavy menses and low iron diet.  Now on supplements daily.  Intermittent numbness- occuring in R arm and occasionally R leg.  Tends to be more noticeable when lying down.  Rarely will occur during the day w/ activity.  Does sleep on R side.  sxs resolve w/in 30-40 minutes of waking.  Family hx of hyperlipidemia- neither mom nor dad were overweight but both w/ elevated cholesterol.  Has not had lipids in last 2-3 yrs.  Pt is exercising regularly.    Review of Systems For ROS see HPI     Objective:   Physical Exam  Vitals reviewed. Constitutional: She is oriented to person, place, and time. She appears well-developed and well-nourished. No distress.  HENT:  Head: Normocephalic and atraumatic.  Eyes: Conjunctivae and EOM are normal. Pupils are equal, round, and reactive to light.  Neck: Normal range of motion. Neck supple. No thyromegaly present.  Cardiovascular: Normal rate, regular rhythm, normal heart sounds and intact distal pulses.   No murmur heard. Pulmonary/Chest: Effort normal and breath sounds normal. No respiratory distress.  Abdominal: Soft. She exhibits no distension. There is no tenderness.  Musculoskeletal: She exhibits no edema.  Lymphadenopathy:    She has no cervical adenopathy.  Neurological: She is alert and oriented to person, place, and time. She has normal reflexes. Coordination normal.  Skin: Skin is warm and dry.  Psychiatric: She has a normal mood and affect. Her behavior is normal.          Assessment & Plan:

## 2014-01-31 NOTE — Assessment & Plan Note (Signed)
New.  Pt due for lipid screen due to family hx.  Get labs today.

## 2014-01-31 NOTE — Patient Instructions (Signed)
Schedule your complete physical in 1 year We'll notify you of your lab results and make any changes if needed Keep up the good work!  You look great! Call with any questions or concerns Welcome!  We're glad to have you!!

## 2014-01-31 NOTE — Assessment & Plan Note (Signed)
New.  Suspect this is due to pt's sleeping position as sxs only occur at night and improve once she wakes but will check B12 and folate to r/o deficiencies as cause of her sxs.

## 2014-01-31 NOTE — Progress Notes (Signed)
Pre visit review using our clinic review tool, if applicable. No additional management support is needed unless otherwise documented below in the visit note. 

## 2014-01-31 NOTE — Assessment & Plan Note (Signed)
New to provider, ongoing for pt.  Currently on OTC iron supplementation.  Following w/ GYN.  Will continue to follow.

## 2014-02-19 ENCOUNTER — Encounter: Payer: Self-pay | Admitting: Medical

## 2014-02-19 ENCOUNTER — Ambulatory Visit (INDEPENDENT_AMBULATORY_CARE_PROVIDER_SITE_OTHER): Payer: BC Managed Care – PPO | Admitting: Medical

## 2014-02-19 ENCOUNTER — Ambulatory Visit (HOSPITAL_BASED_OUTPATIENT_CLINIC_OR_DEPARTMENT_OTHER)
Admission: RE | Admit: 2014-02-19 | Discharge: 2014-02-19 | Disposition: A | Discharge status: Home / Self Care | Payer: BC Managed Care – PPO | Source: Ambulatory Visit | Attending: Medical | Admitting: Medical

## 2014-02-19 ENCOUNTER — Other Ambulatory Visit: Payer: Self-pay | Admitting: Certified Nurse Midwife

## 2014-02-19 VITALS — BP 158/81 | HR 106 | Temp 97.6°F | Ht 64.75 in | Wt 136.2 lb

## 2014-02-19 DIAGNOSIS — M542 Cervicalgia: Secondary | ICD-10-CM | POA: Insufficient documentation

## 2014-02-19 DIAGNOSIS — Z1231 Encounter for screening mammogram for malignant neoplasm of breast: Secondary | ICD-10-CM

## 2014-02-19 MED ORDER — DICLOFENAC POTASSIUM 50 MG PO TABS: ORAL_TABLET | ORAL | Status: DC

## 2014-02-19 NOTE — Patient Instructions (Addendum)
For your transient/occasional  rt upper extremity numbness, I want you to get cervical spine xray and take diclofenac prescription nsaid. While on diclofenac stop ibuprofen and alleve.  Your appear to have features of some sciatica. Diclofenac may help with this.  If at any point you get motor deficits or associated neurologic symptoms with numbness then go to emergency dept.  If after 7 days course of diclofenac you have no imrpovement in either area, then would consider neurology referral possible evaluation/nerve conduction studies.  Sciatica Sciatica is pain, weakness, numbness, or tingling along the path of the sciatic nerve. The nerve starts in the lower back and runs down the back of each leg. The nerve controls the muscles in the lower leg and in the back of the knee, while also providing sensation to the back of the thigh, lower leg, and the sole of your foot. Sciatica is a symptom of another medical condition. For instance, nerve damage or certain conditions, such as a herniated disk or bone spur on the spine, pinch or put pressure on the sciatic nerve. This causes the pain, weakness, or other sensations normally associated with sciatica. Generally, sciatica only affects one side of the body. CAUSES   Herniated or slipped disc.  Degenerative disk disease.  A pain disorder involving the narrow muscle in the buttocks (piriformis syndrome).  Pelvic injury or fracture.  Pregnancy.  Tumor (rare). SYMPTOMS  Symptoms can vary from mild to very severe. The symptoms usually travel from the low back to the buttocks and down the back of the leg. Symptoms can include:  Mild tingling or dull aches in the lower back, leg, or hip.  Numbness in the back of the calf or sole of the foot.  Burning sensations in the lower back, leg, or hip.  Sharp pains in the lower back, leg, or hip.  Leg weakness.  Severe back pain inhibiting movement. These symptoms may get worse with coughing,  sneezing, laughing, or prolonged sitting or standing. Also, being overweight may worsen symptoms. DIAGNOSIS  Your caregiver will perform a physical exam to look for common symptoms of sciatica. He or she may ask you to do certain movements or activities that would trigger sciatic nerve pain. Other tests may be performed to find the cause of the sciatica. These may include:  Blood tests.  X-rays.  Imaging tests, such as an MRI or CT scan. TREATMENT  Treatment is directed at the cause of the sciatic pain. Sometimes, treatment is not necessary and the pain and discomfort goes away on its own. If treatment is needed, your caregiver may suggest:  Over-the-counter medicines to relieve pain.  Prescription medicines, such as anti-inflammatory medicine, muscle relaxants, or narcotics.  Applying heat or ice to the painful area.  Steroid injections to lessen pain, irritation, and inflammation around the nerve.  Reducing activity during periods of pain.  Exercising and stretching to strengthen your abdomen and improve flexibility of your spine. Your caregiver may suggest losing weight if the extra weight makes the back pain worse.  Physical therapy.  Surgery to eliminate what is pressing or pinching the nerve, such as a bone spur or part of a herniated disk. HOME CARE INSTRUCTIONS   Only take over-the-counter or prescription medicines for pain or discomfort as directed by your caregiver.  Apply ice to the affected area for 20 minutes, 3-4 times a day for the first 48-72 hours. Then try heat in the same way.  Exercise, stretch, or perform your usual activities if  these do not aggravate your pain.  Attend physical therapy sessions as directed by your caregiver.  Keep all follow-up appointments as directed by your caregiver.  Do not wear high heels or shoes that do not provide proper support.  Check your mattress to see if it is too soft. A firm mattress may lessen your pain and  discomfort. SEEK IMMEDIATE MEDICAL CARE IF:   You lose control of your bowel or bladder (incontinence).  You have increasing weakness in the lower back, pelvis, buttocks, or legs.  You have redness or swelling of your back.  You have a burning sensation when you urinate.  You have pain that gets worse when you lie down or awakens you at night.  Your pain is worse than you have experienced in the past.  Your pain is lasting longer than 4 weeks.  You are suddenly losing weight without reason. MAKE SURE YOU:  Understand these instructions.  Will watch your condition.  Will get help right away if you are not doing well or get worse. Document Released: 05/05/2001 Document Revised: 11/10/2011 Document Reviewed: 09/20/2011 [facility] Patient Information 2015 [place], [place]. This information is not intended to replace advice given to you by your health care provider. Make sure you discuss any questions you have with your health care provider.  Note after patient left I did notice that her blood pressure was up compared to previous visits. I will send a message to my nurse and asked nurse to call the patient and advised her to check her blood pressures every other day at least for the next week and then give [place] readings of those blood pressure levels. Her BP might have been elevated since I saw for for the first time. Prior readings with Dr. Birdie Riddle were much better.

## 2014-02-19 NOTE — Progress Notes (Signed)
   Subjective:    Patient ID: Betty Huber, female    DOB: [birth year], 48 y.o.   MRN: [id number]  HPI  Pt in states during the day she will get some numbness of rt arm.(From foream up to her rt bicep area.) Was happening more at night. Now happening during the day. This comes and goes during the day. May last for an hour or so then subside. Friday was constant all day long. Over the weekend rt upper extremity sensation numbness much better. Today is very mild. Occasional neck pain when awakes. But not constant pain.   Rt leg numbness on and off. Started 6 weeks ago. She felt initial onset of that with some sciatica type pain related to some moderate activity.  She had exacerbation of rt leg numbness on Thursday and Friday. She was seated in student desk  that was very uncomfortable and felt some leg numbness of her leg. But points to the mid buttox right side as a source of initial pain.   Pt had mid rash(rt cheek) this weekend. No bite or trauma No itching to her cheek. Now better. She speculates maybe it was reaction to Aleve which she used over the weekend. Patient mentioned that she might have an allergic reaction to ibuprofen when she was hospitalized for gynecologic procedure. However she's not really sure if allergic to either Aleve or ibuprofen. She is not willing to take any prednisone.     Review of Systems  Constitutional: Negative for fever, chills, diaphoresis and fatigue.  Respiratory: Positive for wheezing. Negative for cough, chest tightness and shortness of breath.   Cardiovascular: Negative for chest pain and palpitations.  Musculoskeletal: Negative for back pain and neck pain.       No weakness of the upper or lower extremities on the right side ever during the transient numbness episodes.  Neurological: Positive for numbness. Negative for dizziness, tremors, seizures, syncope, facial asymmetry, speech difficulty, weakness, light-headedness and headaches.       Transient  occasional right upper and lower extremity numbness. These rarely if ever coincide at the same time. She has no associated neurologic signs or symptoms such as headache blurred vision nausea vomiting or slurred speech with these transient episodes.   Hematological: Negative for adenopathy. Does not bruise/bleed easily.  Psychiatric/Behavioral: Negative.        Objective:   Physical Exam  General Mental Status- Alert. General Appearance- Not in acute distress.   Skin General: Color- Normal Color. Moisture- Normal Moisture.  Neck Carotid Arteries- Normal color. Moisture- Normal Moisture. No carotid bruits. No JVD. No mid cervical spine tenderness.  Chest and Lung Exam Auscultation: Breath Sounds:-Normal.  Cardiovascular Auscultation:Rythm- Regular. Murmurs & Other Heart Sounds:Auscultation of the heart reveals- No Murmurs.  Abdomen Inspection:-Inspeection Normal. Palpation/Percussion:Note:No mass. Palpation and Percussion of the abdomen reveal- Non Tender, Non Distended + BS, no rebound or guarding.  Back-no mid lumbar spine tenderness to palpation. No pain on straight leg lifts. Right SI area is will tender to palpation.    Neurologic Cranial Nerve exam:- CN III-XII intact(No nystagmus), symmetric smile. Drift Test:- No drift. Romberg Exam:- Negative.  Symmetric smile. Strength:- 5/5 equal and symmetric strength both upper and lower extremities. Patient has no numbness presently on exam of the right upper and the right lower extremity. She has good sharp and dull discrimination presently during exam.  Bilaterally I could not induce Phalen sign.      Assessment & Plan:

## 2014-02-27 ENCOUNTER — Telehealth: Payer: Self-pay | Admitting: Family Medicine

## 2014-02-27 ENCOUNTER — Ambulatory Visit (HOSPITAL_BASED_OUTPATIENT_CLINIC_OR_DEPARTMENT_OTHER)
Admission: RE | Admit: 2014-02-27 | Discharge: 2014-02-27 | Disposition: A | Discharge status: Home / Self Care | Payer: BC Managed Care – PPO | Source: Ambulatory Visit | Attending: Certified Nurse Midwife | Admitting: Certified Nurse Midwife

## 2014-02-27 DIAGNOSIS — Z1231 Encounter for screening mammogram for malignant neoplasm of breast: Secondary | ICD-10-CM | POA: Diagnosis not present

## 2014-02-27 DIAGNOSIS — G629 Polyneuropathy, unspecified: Secondary | ICD-10-CM

## 2014-02-27 NOTE — Telephone Encounter (Signed)
Patient saw Percell Miller on 9/28, but is Dr. Virgil Benedict pt.   See below and advise, patient was never called back and she is upset.

## 2014-02-27 NOTE — Telephone Encounter (Signed)
Get my lpn to offer her neurologist referral as well.

## 2014-02-27 NOTE — Telephone Encounter (Signed)
Caller name: Lyrah Relation to pt: self  Call back number: [phone] Pharmacy: [facility] [phone]   Reason for call: pt calling to check the status of previous phone message

## 2014-02-27 NOTE — Telephone Encounter (Signed)
Caller name: Marae Relation to pt: self Call back number: [phone] Pharmacy[facility]  Reason for call:   Patient states that she did not take medicine that was prescribed at last visit. She states that the pharmacist told her that she may have a reaction to this medication. Patient states that she has been taking ibuprofen for the inflammation but noticed that her legs and face were swelling and thought it was because of the ibuprofen. Patient has stopped taking ibuprofen. Patient wants to know if ibuprofen could do that. Also, patient would like something else called in.  Patient states that it is okay to leave detailed message.

## 2014-02-27 NOTE — Telephone Encounter (Signed)
Will send message to pt via LPN. Pharmacist warned her/thought she migh have rxn to diclofenac. So I won't advise taking. She reports atypical reactions but still may be reaction so won't advise taking diclofenac.

## 2014-02-28 NOTE — Telephone Encounter (Signed)
Patient called spoke with her regarding medications and not taking Diclofenac. 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

## 2014-02-28 NOTE — Telephone Encounter (Signed)
Called patient x2 left message for call back.

## 2014-02-28 NOTE — Telephone Encounter (Signed)
Will refer to neurology

## 2014-03-01 NOTE — Telephone Encounter (Signed)
Left message regarding referral on patients answering machine.

## 2014-04-13 ENCOUNTER — Encounter: Payer: Self-pay | Admitting: Neurology

## 2014-04-13 ENCOUNTER — Ambulatory Visit (INDEPENDENT_AMBULATORY_CARE_PROVIDER_SITE_OTHER): Payer: BC Managed Care – PPO | Admitting: Neurology

## 2014-04-13 VITALS — BP 110/72 | HR 100 | Resp 14 | Ht 66.0 in | Wt 135.0 lb

## 2014-04-13 DIAGNOSIS — R292 Abnormal reflex: Secondary | ICD-10-CM

## 2014-04-13 DIAGNOSIS — R200 Anesthesia of skin: Secondary | ICD-10-CM

## 2014-04-13 DIAGNOSIS — R202 Paresthesia of skin: Secondary | ICD-10-CM

## 2014-04-13 NOTE — Progress Notes (Addendum)
[facility] Clinic Note - Initial Visit   Date: 04/13/2014  Betty Huber MRN: [id number] DOB: 1966/03/08   Dear Dr. Birdie Riddle:   Thank you for your kind referral of Betty Huber for consultation of right sided paresthesias. Although her history is well known to you, please allow [place] to reiterate it for the purpose of our medical record. The patient was accompanied to the clinic by self.    History of Present Illness: Betty Huber is a 48 y.o. right-handed Caucasian female with history of iron deficiency anemia presenting for evaluation of right sided paresthesias.    Starting in the summer of 2015, she developed right arm tinging involving the forearm, and spares the hands. It is worse around her menstrual cycle.  Symptoms do not wake her up from sleeping, but has noticed that it is worse in the morning.  No identifiable triggers.  No neck pain.   In August, she recalls having low back pain with radiation into her right leg.  It is worse after a full day of standing and improved by rest.  She denies any weakness, but at times she will get sharp pain over the left buttocks which can occur abruptly. Heat compresses helps her low back discomfort.  These symptoms do tend to respond to tylenol.  She has occasional headaches with associated vision changes during her menstrual cycle.     Out-side paper records, electronic medical record, and images have been reviewed where available and summarized as:  Labs 01/31/2014:  Vitamin B12 407, TSH 1.19, ferritin 14  XR cervical spine 02/19/2014:  Normal  Past Medical History  Diagnosis Date  . Arm laceration     3rd grade  . Allergy   hyp  Past Surgical History  Procedure Laterality Date  . Wisdom tooth extraction  2008  . Dilatation & curettage/hysteroscopy with trueclear N/A 12/11/2013    Procedure: DILATATION & CURETTAGE/HYSTEROSCOPY WITH TRUCLEAR;  Surgeon: Azalia Bilis, MD;  Location: [facility];  Service:  Gynecology;  Laterality: N/A;     Medications:  No current outpatient prescriptions on file prior to visit.   No current facility-administered medications on file prior to visit.    Allergies: No Known Allergies  Family History: Family History  Problem Relation Age of Onset  . Hypertension Mother 78  . Cancer Mother 54    uterine cancer & lung cancer  . Hyperlipidemia Mother   . Hypertension Father     36s  . Cancer Father     kidney  . Hyperlipidemia Father   . Diabetes Maternal Grandmother   . Emphysema Maternal Grandfather     Social History: History   Social History  . Marital Status: Single    Spouse Name: N/A    Number of Children: N/A  . Years of Education: N/A   Occupational History  . Not on file.   Social History Main Topics  . Smoking status: Never Smoker   . Smokeless tobacco: Not on file  . Alcohol Use: No  . Drug Use: No  . Sexual Activity:    Partners: Male    Birth Control/ Protection: Abstinence   Other Topics Concern  . Not on file   Social History Narrative    Review of Systems:  CONSTITUTIONAL: No fevers, chills, night sweats, or weight loss.   EYES: No visual changes or eye pain ENT: No hearing changes.  No history of nose bleeds.   RESPIRATORY: No cough, wheezing and shortness of breath.  CARDIOVASCULAR: Negative for chest pain, and palpitations.   GI: Negative for abdominal discomfort, blood in stools or black stools.  No recent change in bowel habits.   GU:  No history of incontinence.   MUSCLOSKELETAL: No history of joint pain or swelling.  No myalgias.   SKIN: Negative for lesions, rash, and itching.   HEMATOLOGY/ONCOLOGY: Negative for prolonged bleeding, bruising easily, and swollen nodes.  No history of cancer.   ENDOCRINE: Negative for cold or heat intolerance, polydipsia or goiter.   PSYCH:  No depression or anxiety symptoms.   NEURO: As Above.   Vital Signs:  BP 110/72 mmHg  Pulse 100  Resp 14  Ht 5\' 6"  (1.676  m)  Wt 135 lb (61.236 kg)  BMI 21.80 kg/m2  SpO2 99%  LMP 04/02/2014   General Medical Exam:   General:  Well appearing, comfortable.   Eyes/ENT: see cranial nerve examination.   Neck: No masses appreciated.  Full range of motion without tenderness.  No carotid bruits.  Negative Lhermitte's sign Respiratory:  Clear to auscultation, good air entry bilaterally.   Cardiac:  Regular rate and rhythm, no murmur.   Extremities:  No deformities, edema, or skin discoloration. Good capillary refill.   Skin:  Skin color, texture, turgor normal. No rashes or lesions.  Neurological Exam: MENTAL STATUS including orientation to time, place, person, recent and remote memory, attention span and concentration, language, and fund of knowledge is normal.  Speech is not dysarthric.  CRANIAL NERVES: II:  No visual field defects.  Unremarkable fundi.   III-IV-VI: Pupils equal round and reactive to light.  Normal conjugate, extra-ocular eye movements in all directions of gaze.  No nystagmus.  No ptosis.   V:  Normal facial sensation.     VII:  Normal facial symmetry and movements.  No pathologic facial reflexes.  VIII:  Normal hearing and vestibular function.   IX-X:  Normal palatal movement.   XI:  Normal shoulder shrug and head rotation.   XII:  Normal tongue strength and range of motion, no deviation or fasciculation.  MOTOR:  No atrophy, fasciculations or abnormal movements.  No pronator drift.     Right Upper Extremity:    Left Upper Extremity:    Deltoid  5/5   Deltoid  5/5   Biceps  5/5   Biceps  5/5   Triceps  5/5   Triceps  5/5   Wrist extensors  5/5   Wrist extensors  5/5   Wrist flexors  5/5   Wrist flexors  5/5   Finger extensors  5/5   Finger extensors  5/5   Finger flexors  5/5   Finger flexors  5/5   Dorsal interossei  5-/5   Dorsal interossei  5-/5   Abductor pollicis  5/5   Abductor pollicis  5/5   Tone (Ashworth scale)  0  Tone (Ashworth scale)  0   Right Lower Extremity:    Left  Lower Extremity:    Hip flexors  5/5   Hip flexors  5/5   Hip extensors  5/5   Hip extensors  5/5   Knee flexors  5/5   Knee flexors  5/5   Knee extensors  5/5   Knee extensors  5/5   Dorsiflexors  5/5   Dorsiflexors  5/5   Plantarflexors  5/5   Plantarflexors  5/5   Toe extensors  5/5   Toe extensors  5/5   Toe flexors  5/5   Toe flexors  5/5   Tone (Ashworth scale)  0+  Tone (Ashworth scale)  0   MSRs:  Right                                                                 Left brachioradialis 3+  brachioradialis 3+  biceps 3+  biceps 3+  triceps 3+  triceps 3+  patellar 3+  patellar 3+  ankle jerk 2+  ankle jerk 2+  Hoffman no  Hoffman no  plantar response up  plantar response down   SENSORY:  Normal and symmetric perception of light touch, pinprick, vibration, and proprioception.  Romberg's sign absent.   COORDINATION/GAIT: Normal finger-to- nose-finger and heel-to-shin.  Intact rapid alternating movements bilaterally.  Able to rise from a chair without using arms.  Gait narrow based and stable. Tandem and stressed gait intact.    IMPRESSION/PLAN: Ms. Cullimore is a 48 year-old female presenting for evaluation of right sided paresthesias involving the arm and leg.  Her neurological exam is notable for generalized hyperreflexia with extensor plantar response on the right.  Based on her constellation of symptoms and her age, MRI brain wwo contrast will be ordered to look for intracranial pathology, namely demyelinating disease.  If imaging is nondiagnostic, EMG of the right arm and leg will be next. Other consideration include cervical/lumbosacral radiculopathy, less likely migraine equivalent syndrome.  I offered neuralgesic medication but patient reports symptoms are more annoying than painful, so would like to pursue diagnostic work-up first.  Labs can be ordered pending results of imaging and EMG.  Return to clinic 6-weeks.   The duration of this appointment visit was 40 minutes  of face-to-face time with the patient.  Greater than 50% of this time was spent in counseling, explanation of diagnosis, planning of further management, and coordination of care.   Thank you for allowing me to participate in patient's care.  If I can answer any additional questions, I would be pleased to do so.    Sincerely,    Donika K. Posey Pronto, DO

## 2014-04-13 NOTE — Patient Instructions (Addendum)
1.  MRI brain wwo contrast 2.  EMG of the right arm and leg 3.  Return to clinic in 2 months or sooner as needed

## 2014-04-28 ENCOUNTER — Ambulatory Visit
Admission: RE | Admit: 2014-04-28 | Discharge: 2014-04-28 | Disposition: A | Discharge status: Home / Self Care | Payer: BC Managed Care – PPO | Source: Ambulatory Visit | Attending: Neurology | Admitting: Neurology

## 2014-04-28 DIAGNOSIS — R292 Abnormal reflex: Secondary | ICD-10-CM

## 2014-04-28 DIAGNOSIS — R200 Anesthesia of skin: Secondary | ICD-10-CM

## 2014-04-28 DIAGNOSIS — R202 Paresthesia of skin: Secondary | ICD-10-CM

## 2014-04-28 MED ORDER — GADOBENATE DIMEGLUMINE 529 MG/ML IV SOLN
12.0000 mL | Freq: Once | INTRAVENOUS | Status: AC | PRN
  Administered 2014-04-28: 12 mL via INTRAVENOUS

## 2014-04-30 ENCOUNTER — Telehealth: Payer: Self-pay | Admitting: Neurology

## 2014-04-30 NOTE — Telephone Encounter (Signed)
Patient was notified of her MRI results

## 2014-04-30 NOTE — Telephone Encounter (Signed)
Pt left messages, says she is returning a call to Caryl Pina, please call back at [phone] / Sherri S.

## 2014-05-14 ENCOUNTER — Ambulatory Visit (INDEPENDENT_AMBULATORY_CARE_PROVIDER_SITE_OTHER): Payer: BC Managed Care – PPO | Admitting: Neurology

## 2014-05-14 DIAGNOSIS — R202 Paresthesia of skin: Secondary | ICD-10-CM

## 2014-05-14 DIAGNOSIS — R292 Abnormal reflex: Secondary | ICD-10-CM

## 2014-05-14 DIAGNOSIS — R200 Anesthesia of skin: Secondary | ICD-10-CM

## 2014-05-14 NOTE — Procedures (Signed)
[facility]  [address], [address]  [place], [place] [address] Tel: [phone] Fax:  [phone] Test Date:  05/14/2014  Patient: Betty Huber DOB: March 16, 1966 Physician: Narda Amber  Sex: Female Height: 5\' 6"  Ref Phys: Narda Amber  ID#: [id number] Temp: 36.4C Technician: Laureen Ochs R. NCS T.   Patient Complaints: This is a 48 year-old female presenting for evaluation of right sided numbness/tingling.  NCV & EMG Findings: Extensive electrodiagnostic testing of the right upper and lower extremity shows:  1. Right median, ulnar, radial, and palmar sensory responses are within normal limits. 2. Right median motor responses within normal limits. Right ulnar motor response shows conduction velocity slowing across the elbow (A Elbow-B Elbow, 45 m/s).   3. Right sural and superficial peroneal sensory responses are within normal limits. 4. Right tibial and peroneal motor responses are within normal limits. 5. In the arms, there is no evidence of active or chronic motor axon loss changes affecting any of the tested muscles. 6. In the legs, there is sparse chronic motor axonal loss changes affecting the medial gastrocnemius and biceps femoris short head muscles.   Impression: 1. Right ulnar neuropathy at the elbow, purely demyelinating in type. 2. Chronic S1 radiculopathy affecting the right lower extremity, very mild degree electrically. 3. There is no evidence of a generalized sensorimotor polyneuropathy affecting the right side.   ___________________________ Narda Amber    Nerve Conduction Studies Anti Sensory Summary Table   Stim Site NR Peak (ms) Norm Peak (ms) P-T Amp (V) Norm P-T Amp  Right Median Anti Sensory (2nd Digit)  Wrist    3.1 <3.4 68.1 >20  Right Radial Anti Sensory (Base 1st Digit)  Wrist    2.4 <2.7 42.1 >18  Right Sup Peroneal Anti Sensory (Ant Lat Mall)  12 cm    3.0 <4.5 8.4 >5  Right Sural Anti Sensory (Lat Mall)  Calf    3.5 <4.5  17.0 >5  Right Ulnar Anti Sensory (5th Digit)  Wrist    3.0 <3.1 67.6 >12   Motor Summary Table   Stim Site NR Onset (ms) Norm Onset (ms) O-P Amp (mV) Norm O-P Amp Site1 Site2 Delta-0 (ms) Dist (cm) Vel (m/s) Norm Vel (m/s)  Right Median Motor (Abd Poll Brev)  Wrist    2.7 <3.9 12.9 >6 Elbow Wrist 4.2 25.0 60 >50  Elbow    6.9  12.6         Right Peroneal Motor (Ext Dig Brev)  Ankle    3.0 <5.5 4.1 >3 B Fib Ankle 6.7 33.0 49 >40  B Fib    9.7  3.9  Poplt B Fib 2.2 9.0 41 >40  Poplt    11.9  3.7         Right Peroneal TA Motor (Tib Ant)  Fib Head    2.7 <4.0 7.0 >4 Poplit Fib Head 1.6 9.0 56 >40  Poplit    4.3  6.8         Right Tibial Motor (Abd Hall Brev)  Ankle    4.6 <6.0 16.4 >8 Knee Ankle 7.5 37.0 49 >40  Knee    12.1  11.2         Right Ulnar Motor (Abd Dig Minimi)  Wrist    2.6 <3.1 12.0 >7 B Elbow Wrist 2.9 22.0 76 >50  B Elbow    5.5  10.6  A Elbow B Elbow 2.2 10.0 45 >50  A Elbow    7.7  9.4          Comparison Summary Table   Stim Site NR Peak (ms) Norm Peak (ms) P-T Amp (V) Site1 Site2 Delta-P (ms) Norm Delta (ms)  Right Median/Ulnar Palm Comparison (Wrist - 8cm)  Median Palm    2.0 <2.2 52.2 Median Palm Ulnar Palm 0.2   Ulnar Palm    1.8 <2.2 59.1       H Reflex Studies   NR H-Lat (ms) Lat Norm (ms) L-R H-Lat (ms)  Right Tibial (Gastroc)     31.97 <35    EMG   Side Muscle Ins Act Fibs Psw Fasc Number Recrt Dur Dur. Amp Amp. Poly Poly. Comment  Right 1stDorInt Nml Nml Nml Nml Nml Nml Nml Nml Nml Nml Nml Nml N/A  Right ABD Dig Min Nml Nml Nml Nml Nml Nml Nml Nml Nml Nml Nml Nml N/A  Right Ext Indicis Nml Nml Nml Nml Nml Nml Nml Nml Nml Nml Nml Nml N/A  Right FlexDigProf 4,5 Nml Nml Nml Nml Nml Nml Nml Nml Nml Nml Nml Nml N/A  Right Biceps Nml Nml Nml Nml Nml Nml Nml Nml Nml Nml Nml Nml N/A  Right Triceps Nml Nml Nml Nml Nml Nml Nml Nml Nml Nml Nml Nml N/A  Right Deltoid Nml Nml Nml Nml Nml Nml Nml Nml Nml Nml Nml Nml N/A  Right AntTibialis Nml Nml Nml Nml  Nml Nml Nml Nml Nml Nml Nml Nml N/A  Right Gastroc Nml Nml Nml Nml 1- Mod-R Some 1+ Few 1+ Nml Nml N/A  Right Flex Dig Long Nml Nml Nml Nml Nml Nml Nml Nml Nml Nml Nml Nml N/A  Right RectFemoris Nml Nml Nml Nml Nml Nml Nml Nml Nml Nml Nml Nml N/A  Right GluteusMed Nml Nml Nml Nml Nml Nml Nml Nml Nml Nml Nml Nml N/A  Right BicepsFemS Nml Nml Nml Nml 1- Mod Few 1+ Few 1+ Nml Nml N/A      Waveforms:

## 2014-05-23 ENCOUNTER — Telehealth: Payer: Self-pay | Admitting: Neurology

## 2014-05-23 NOTE — Telephone Encounter (Signed)
Pt called wanting to f/u on the results of her EMG. Please call pt# [phone]

## 2014-05-23 NOTE — Telephone Encounter (Signed)
Please advise 

## 2014-05-24 NOTE — Telephone Encounter (Signed)
Dr. Posey Pronto is off right now but it looks like there is a pinched nerve around the elbow on the right and a very mild pinched nerve in the low back (nothing surgical or anything).  May just require some PT but will leave that to discussion between pt and Dr. Posey Pronto at f/u appt

## 2014-05-24 NOTE — Telephone Encounter (Signed)
Left patient a message giving her the results and informed her that I would call her back with any further instructions from Dr. Posey Pronto.

## 2014-05-28 ENCOUNTER — Telehealth: Payer: Self-pay | Admitting: *Deleted

## 2014-05-28 NOTE — Telephone Encounter (Signed)
Called patient back and informed her that we would be moving her appointment up and would call her with next appointment.

## 2014-05-28 NOTE — Telephone Encounter (Signed)
See next note

## 2014-05-28 NOTE — Telephone Encounter (Signed)
Patient informed new appointment is on 06-08-14 at 2:15.

## 2014-05-28 NOTE — Telephone Encounter (Signed)
Patient wanted to know what she needs to do next since she had had her EMG she also wanted them released to [facility] Call back number [phone]

## 2014-05-28 NOTE — Telephone Encounter (Signed)
Agree with Dr. Doristine Devoid recommendations. Would be easier to discuss options in the clinic - she can move her f/u appointment sooner.   Syan Cullimore K. Posey Pronto, DO

## 2014-05-28 NOTE — Telephone Encounter (Signed)
Please advise with any further instructions.  Thanks.

## 2014-06-08 ENCOUNTER — Ambulatory Visit (INDEPENDENT_AMBULATORY_CARE_PROVIDER_SITE_OTHER): Payer: BC Managed Care – PPO | Admitting: Neurology

## 2014-06-08 ENCOUNTER — Encounter: Payer: Self-pay | Admitting: Neurology

## 2014-06-08 VITALS — BP 130/80 | HR 100 | Ht 65.0 in | Wt 138.1 lb

## 2014-06-08 DIAGNOSIS — M5431 Sciatica, right side: Secondary | ICD-10-CM

## 2014-06-08 DIAGNOSIS — G5621 Lesion of ulnar nerve, right upper limb: Secondary | ICD-10-CM

## 2014-06-08 MED ORDER — CYCLOBENZAPRINE HCL 5 MG PO TABS: 5.0000 mg | ORAL_TABLET | Freq: Every day | ORAL | Status: DC

## 2014-06-08 NOTE — Progress Notes (Signed)
Follow-up Visit   Date: 04/09/2015   Betty Huber MRN: [id number] DOB: [birth year]   Interim History: Betty Huber is a 49 y.o. right-handed Caucasian female with iron deficient anemia returning to the clinic for follow-up of right sided paresthesias and pain.  The patient was accompanied to the clinic by self.  History of present illness: Starting in the summer of 2015, she developed right arm tinging involving the forearm, and spares the hands. It is worse around her menstrual cycle. Symptoms do not wake her up from sleeping, but has noticed that it is worse in the morning. No identifiable triggers. No neck pain.   In August, she recalls having low back pain with radiation into her right leg. It is worse after a full day of standing and improved by rest. She denies any weakness, but at times she will get sharp pain over the left buttocks which can occur abruptly. Heat compresses helps her low back discomfort. These symptoms do tend to respond to tylenol.  She has occasional headaches with associated vision changes during her menstrual cycle.   UPDATE 06/08/2014:  She reports having improvement of hand paresthesias after paying attention to positioning, now having symptoms 3-4 times per week and only when she over uses it. She denies any weakness.  Her right leg remains unchanged and she continues to have right buttocks pain.   Medications:  Current Outpatient Prescriptions on File Prior to Visit  Medication Sig Dispense Refill  . ferrous gluconate (FERGON) 225 (27 FE) MG tablet Take 240 mg by mouth daily.     No current facility-administered medications on file prior to visit.    Allergies: No Known Allergies  Review of Systems:  CONSTITUTIONAL: No fevers, chills, night sweats, or weight loss.  EYES: No visual changes or eye pain ENT: No hearing changes.  No history of nose bleeds.   RESPIRATORY: No cough, wheezing and shortness of breath.   CARDIOVASCULAR: Negative  for chest pain, and palpitations.   GI: Negative for abdominal discomfort, blood in stools or black stools.  No recent change in bowel habits.   GU:  No history of incontinence.   MUSCLOSKELETAL: +history of joint pain or swelling.  No myalgias.   SKIN: Negative for lesions, rash, and itching.   ENDOCRINE: Negative for cold or heat intolerance, polydipsia or goiter.   PSYCH:  No depression or anxiety symptoms.   NEURO: As Above.   Vital Signs:  BP 130/80 mmHg  Pulse 100  Ht 5\' 5"  (1.651 m)  Wt 138 lb 1 oz (62.625 kg)  BMI 22.97 kg/m2  SpO2 98%  Neurological Exam: MENTAL STATUS including orientation to time, place, person, recent and remote memory, attention span and concentration, language, and fund of knowledge is normal.  Speech is not dysarthric.  CRANIAL NERVES:  Pupils equal round and reactive to light.  Normal conjugate, extra-ocular eye movements in all directions of gaze.  Face is symmetric. Palate elevates symmetrically.  Tongue is midline.  MOTOR:  Motor strength is 5/5 in all extremities.  No atrophy, fasciculations or abnormal movements.  Tone is normal.    MSRs:  Reflexes are 3+/4 throughout, except 2+ Achilles bilaterally.  Plantars are mute bilaterally.  SENSORY:  Intact to vibration.  COORDINATION/GAIT:   Gait narrow based and stable.   Data: EMG 05/14/2014: 1. Right ulnar neuropathy at the elbow, purely demyelinating in type. 2. Chronic S1 radiculopathy affecting the right lower extremity, very mild degree electrically. 3. There is no evidence of  a generalized sensorimotor polyneuropathy affecting the right side.  Labs 01/31/2014: Vitamin B12 407, TSH 1.19, ferritin 14  XR cervical spine 02/19/2014: Normal  MRI brain wwo contrast 04/28/2014:  Normal   IMPRESSION: 1.  Right sciatica, clinically unchanged 2.  Right ulnar neuropathy, slowly improving   PLAN/RECOMMENDATIONS:  1.  Start flexeril 5mg  at bedtime 2.  Start physical therapy for leg  stretching 3.  Start using soft elbow pad and avoid over flexion of the elbow 4.  Consider MRI lumbar spine if no improvement with PT 5.  Return to clinic as needed   The duration of this appointment visit was 25 minutes of face-to-face time with the patient.  Greater than 50% of this time was spent in counseling, explanation of diagnosis, planning of further management, and coordination of care.   Thank you for allowing me to participate in patient's care.  If I can answer any additional questions, I would be pleased to do so.    Sincerely,    Donika K. Posey Pronto, DO

## 2014-06-08 NOTE — Patient Instructions (Addendum)
1.  Start flexeril 5mg  at bedtime 2.  Start physical therapy for leg stretching 3.  Start using soft elbow pad and avoid over flexion of the elbow 4.  Return to clinic as needed

## 2014-06-11 ENCOUNTER — Telehealth: Payer: Self-pay

## 2014-06-11 ENCOUNTER — Other Ambulatory Visit: Payer: Self-pay | Admitting: *Deleted

## 2014-06-11 DIAGNOSIS — M5431 Sciatica, right side: Secondary | ICD-10-CM

## 2014-06-25 ENCOUNTER — Ambulatory Visit: Payer: BC Managed Care – PPO | Admitting: Rehabilitation

## 2014-07-01 ENCOUNTER — Encounter: Payer: Self-pay | Admitting: Neurology

## 2014-07-03 ENCOUNTER — Ambulatory Visit: Payer: BC Managed Care – PPO | Admitting: Neurology

## 2014-07-04 NOTE — Telephone Encounter (Signed)
Opened in error

## 2014-10-15 ENCOUNTER — Telehealth: Payer: Self-pay | Admitting: Certified Nurse Midwife

## 2014-10-15 DIAGNOSIS — D259 Leiomyoma of uterus, unspecified: Secondary | ICD-10-CM

## 2014-10-15 NOTE — Telephone Encounter (Signed)
Patient having issues with cycle coming every 2 weeks and having leg pain during that time.

## 2014-10-15 NOTE — Telephone Encounter (Signed)
Spoke with patient. Patient states that she has been experiencing irregular cycles since September. Not currently on any form of birth control. Patient states she is having cycles every two weeks. LMP ended 5/20. Patient has been experiencing lower back pain and right leg numbness before cycles. Denies any current lower back pain or leg numbness. Denies any current urinary symptoms. Denies color change to right leg, feeling of warmth, or swelling of leg. Patient has history of fibroids and polyps. Had D&C and Hysteroscopy with Dr.Lathrop on 12/11/2013. Last PUS on 12/05/2013. Patient has had nerve conduction study which should compression of a nerve but not exact location. Patient has concern fibroids are causing these symptoms. 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 Advised will speak with Dr.Miller regarding follow up appointment and return call with further recommendations. Patient is agreeable.  Dr.Miller should patient have OV before PUS or okay for follow up PUS? Last seen 12/27/2013. Aex scheduled for 11/23/2014 with Regina Eck CNM.

## 2014-10-15 NOTE — Telephone Encounter (Signed)
Left message to call Karlye Ihrig at [phone]. 

## 2014-10-16 NOTE — Telephone Encounter (Signed)
I think ok to proceed with PUS now and OV same day.  Thanks.

## 2014-10-16 NOTE — Telephone Encounter (Signed)
Spoke with patient. Advised of message as seen below from [place]. Patient is agreeable. Appointment scheduled for 6/9 at 3:30pm with 4pm consult with Dr.Miller. Patient is agreeable to date and time. Order for PUS placed for precert.  Routing to provider for final review. Patient agreeable to disposition. Will close encounter.

## 2014-10-16 NOTE — Telephone Encounter (Signed)
Left message to call Chosen Geske at [phone]. 

## 2014-11-01 ENCOUNTER — Ambulatory Visit (INDEPENDENT_AMBULATORY_CARE_PROVIDER_SITE_OTHER): Payer: BC Managed Care – PPO | Admitting: Obstetrics & Gynecology

## 2014-11-01 ENCOUNTER — Ambulatory Visit (INDEPENDENT_AMBULATORY_CARE_PROVIDER_SITE_OTHER): Payer: BC Managed Care – PPO

## 2014-11-01 VITALS — BP 138/84 | HR 92 | Resp 16 | Wt 136.0 lb

## 2014-11-01 DIAGNOSIS — N852 Hypertrophy of uterus: Secondary | ICD-10-CM

## 2014-11-01 DIAGNOSIS — N938 Other specified abnormal uterine and vaginal bleeding: Secondary | ICD-10-CM

## 2014-11-01 DIAGNOSIS — D259 Leiomyoma of uterus, unspecified: Secondary | ICD-10-CM

## 2014-11-01 DIAGNOSIS — N839 Noninflammatory disorder of ovary, fallopian tube and broad ligament, unspecified: Secondary | ICD-10-CM

## 2014-11-01 DIAGNOSIS — D251 Intramural leiomyoma of uterus: Secondary | ICD-10-CM | POA: Diagnosis not present

## 2014-11-01 DIAGNOSIS — N838 Other noninflammatory disorders of ovary, fallopian tube and broad ligament: Secondary | ICD-10-CM

## 2014-11-01 MED ORDER — NORETHIN-ETH ESTRAD-FE BIPHAS 1 MG-10 MCG / 10 MCG PO TABS: 1.0000 | ORAL_TABLET | Freq: Every day | ORAL | Status: DC

## 2014-11-01 NOTE — Progress Notes (Signed)
49 y.o. G0 Singlefemale here for a pelvic ultrasound due to increased irregular bleeding.  Pt with hx of uterine fibroids.  Pt underwent hysteroscopic resection of submucosal fitoid last year in July with Dr. Charlies Constable.  Also having some pelvic pressure and leg numbness.  She is trying to decide if she should have something done with her fibroids or if this is an ortho related issues.  Has not had back MRI to date.  No LMP recorded.  Sexually active:  yes  Contraception: abstinence  FINDINGS: UTERUS: 9.8 x 6 x 5.2cm with five fibroids, largest is 4.2cm, then 2.6, 2.8, 2.1, and 1.5cm (ovarall uterine size is stable but fibroids are enlarged in size in last 10 months EMS: 14.42mm ADNEXA:   Left ovary 6.1 x 4.1 x 3.5cm with 5.9 x 4.4 x 4.3cm avascular, enlarged but smooth bordered cyst with internal echoes.  C/W hemorrhagic cyst.  Does have multiloculations.   Right ovary 3.1 x 2.4 x 5.4GB with 20FE follicle CUL DE SAC:  D/W findings with pt including uterine fibroids and thickened endometrium and enlarged left ovary.  Feel need to monitor ovary to see resolution.  Would like to have PUS done next month after bleeding episode if possible to see if endometrium is still thickened.  If so, SHGM may be most appropriate.  Do feel pt could have symptoms with her fibroid uterus but leg numbness would be an atypical symptom.  Before she has a hysterectomy or even really contemplates it, I feel she should see ortho and have an evaluation including possible MRI.  Pt in agreement.  Pt would like to start something to see if irregular bleeding can be improved.  OCPs, progesterone methods, IUD, Depo Provera, and nexplanon discussed.  Pt would like to start OCP.  Headache, DVT/PE, stroke, MI, nausea, elevated BP discussed.  Questions addressed  Assessment:  Enlarged uterus, multiple fibroids, h/o hysteroscopic resection of submucosal fibroid, Hemorrhagic left ovarian cyst, DUB  Plan: Ca 125 Repeat PUS 1 month to  ensure ovarian cyst is resolving Consider SHGM or endometrium biopsy if endometrium remains thickened Start Loloestrin.  Rx to pharmacy. Encouraged ortho consultation and possible MRI   ~25 minutes spent with patient >50% of time was in face to face discussion of above.

## 2014-11-01 NOTE — Progress Notes (Signed)
Scheduled patient while in office for 4 week follow up ultrasound with Dr.Miller. Appointment scheduled for 7/14 at 3:30pm with 4pm consult with Dr.Miller. Patient is agreeable to date and time.

## 2014-11-02 LAB — CA 125: CA 125: 8 U/mL (ref <35)

## 2014-11-04 ENCOUNTER — Encounter: Payer: Self-pay | Admitting: Obstetrics & Gynecology

## 2014-11-23 ENCOUNTER — Ambulatory Visit (INDEPENDENT_AMBULATORY_CARE_PROVIDER_SITE_OTHER): Payer: BC Managed Care – PPO | Admitting: Certified Nurse Midwife

## 2014-11-23 ENCOUNTER — Encounter: Payer: Self-pay | Admitting: Certified Nurse Midwife

## 2014-11-23 VITALS — BP 102/70 | HR 70 | Resp 16 | Ht 65.25 in | Wt 136.0 lb

## 2014-11-23 DIAGNOSIS — Z124 Encounter for screening for malignant neoplasm of cervix: Secondary | ICD-10-CM | POA: Diagnosis not present

## 2014-11-23 DIAGNOSIS — D649 Anemia, unspecified: Secondary | ICD-10-CM

## 2014-11-23 DIAGNOSIS — Z01419 Encounter for gynecological examination (general) (routine) without abnormal findings: Secondary | ICD-10-CM | POA: Diagnosis not present

## 2014-11-23 DIAGNOSIS — R5383 Other fatigue: Secondary | ICD-10-CM | POA: Diagnosis not present

## 2014-11-23 DIAGNOSIS — Z0000 Encounter for general adult medical examination without abnormal findings: Secondary | ICD-10-CM

## 2014-11-23 LAB — CBC
HEMATOCRIT: 41.9 % (ref 36.0–46.0)
HEMOGLOBIN: 14.4 g/dL (ref 12.0–15.0)
MCH: 31.3 pg (ref 26.0–34.0)
MCHC: 34.4 g/dL (ref 30.0–36.0)
MCV: 91.1 fL (ref 78.0–100.0)
MPV: 8.7 fL (ref 8.6–12.4)
Platelets: 357 10*3/uL (ref 150–400)
RBC: 4.6 MIL/uL (ref 3.87–5.11)
RDW: 12.9 % (ref 11.5–15.5)
WBC: 6.8 10*3/uL (ref 4.0–10.5)

## 2014-11-23 LAB — FERRITIN: Ferritin: 28 ng/mL (ref 10–291)

## 2014-11-23 LAB — IRON: Iron: 156 ug/dL — ABNORMAL HIGH (ref 42–145)

## 2014-11-23 LAB — IBC PANEL
%SAT: 50 % (ref 20–55)
TIBC: 309 ug/dL (ref 250–470)
UIBC: 153 ug/dL (ref 125–400)

## 2014-11-23 NOTE — Progress Notes (Signed)
49 y.o. G0P0000 Single  Caucasian Fe here for annual exam.   Periods normal,no issues. Contraception working well at this point, will advise if problems. Had pinched ulnar nerve on right PT therapy. Still under follow up with fibroids, PUS scheduled for recheck. Still having some fatigue, but eating well. Sees PCP yearly now for aex.  No other health issues today.   Patient's last menstrual period was 11/16/2014.          Sexually active: No.  The current method of family planning is OCP (estrogen/progesterone).    Exercising: Yes.    walking Smoker:  no  Health Maintenance: Pap: 11-20-13 neg MMG: 02-27-14 category c density,birads 1:neg Colonoscopy:  none BMD:   none TDaP:  2009 Labs: none Self breast exam: done occ   reports that she has never smoked. She does not have any smokeless tobacco history on file. She reports that she does not drink alcohol or use illicit drugs.  Past Medical History  Diagnosis Date  . Arm laceration     3rd grade  . Allergy   . Anemia     Past Surgical History  Procedure Laterality Date  . Wisdom tooth extraction  2008  . Dilatation & curettage/hysteroscopy with trueclear N/A 12/11/2013    Procedure: DILATATION & CURETTAGE/HYSTEROSCOPY WITH TRUCLEAR;  Surgeon: Azalia Bilis, MD;  Location: [facility];  Service: Gynecology;  Laterality: N/A;    Current Outpatient Prescriptions  Medication Sig Dispense Refill  . ferrous gluconate (FERGON) 225 (27 FE) MG tablet Take 240 mg by mouth daily.    Marland Kitchen ibuprofen (ADVIL,MOTRIN) 200 MG tablet Take 200 mg by mouth every 6 (six) hours as needed.    . Norethindrone-Ethinyl Estradiol-Fe Biphas (LO LOESTRIN FE) 1 MG-10 MCG / 10 MCG tablet Take 1 tablet by mouth daily. 1 Package 3  . cyclobenzaprine (FLEXERIL) 5 MG tablet Take 1 tablet (5 mg total) by mouth at bedtime. (Patient not taking: Reported on 11/23/2014) 30 tablet 5   No current facility-administered medications for this visit.    Family History  Problem  Relation Age of Onset  . Hypertension Mother 66  . Cancer Mother 72    uterine cancer & lung cancer  . Hyperlipidemia Mother     Deceased  . Hypertension Father     68s  . Cancer Father     kidney  . Hyperlipidemia Father     Deceased  . Diabetes Maternal Grandmother   . Emphysema Maternal Grandfather     ROS:  Pertinent items are noted in HPI.  Otherwise, a comprehensive ROS was negative.  Exam:   BP 102/70 mmHg  Pulse 70  Resp 16  Ht 5' 5.25" (1.657 m)  Wt 136 lb (61.689 kg)  BMI 22.47 kg/m2  LMP 11/16/2014 Height: 5' 5.25" (165.7 cm) Ht Readings from Last 3 Encounters:  11/23/14 5' 5.25" (1.657 m)  06/08/14 5\' 5"  (1.651 m)  04/13/14 5\' 6"  (1.676 m)    General appearance: alert, cooperative and appears stated age Head: Normocephalic, without obvious abnormality, atraumatic Neck: no adenopathy, supple, symmetrical, trachea midline and thyroid normal to inspection and palpation Lungs: clear to auscultation bilaterally Breasts: normal appearance, no masses or tenderness, No nipple retraction or dimpling, No nipple discharge or bleeding, No axillary or supraclavicular adenopathy Heart: regular rate and rhythm Abdomen: soft, non-tender; no masses,  no organomegaly Extremities: extremities normal, atraumatic, no cyanosis or edema Skin: Skin color, texture, turgor normal. No rashes or lesions Lymph nodes: Cervical, supraclavicular, and axillary  nodes normal. No abnormal inguinal nodes palpated Neurologic: Grossly normal   Pelvic: External genitalia:  no lesions              Urethra:  normal appearing urethra with no masses, tenderness or lesions              Bartholin's and Skene's: normal                 Vagina: normal appearing vagina with normal color and discharge, no lesions              Cervix: normal,non tender, no lesions              Pap taken: Yes.   Bimanual Exam:  Uterus:  enlarged, 10 weeks size, non tender              Adnexa: normal adnexa and no mass,  fullness, tenderness               Rectovaginal: Confirms               Anus:  normal sphincter tone, no lesions  Chaperone present: Yes  A:  Well Woman with normal exam  Contraception Lo loestrin Fe for cycle control only  History of fibroids under follow up PUS scheduled for recheck  History of anemia/fatigue  P:   Reviewed health and wellness pertinent to exam  Rx Lo loestrin Fe see order with instructions to come in 3 months to check BP and status with use  Keep appointment as scheduled  Lab: CBC,IBC,Iron,Ferritin, Vit. D  Pap smear taken with HPVHR   counseled on breast self exam, mammography screening, use and side effects of OCP's, adequate intake of calcium and vitamin D, diet and exercise  return annually or prn  An After Visit Summary was printed and given to the patient.

## 2014-11-23 NOTE — Patient Instructions (Signed)

## 2014-11-24 LAB — VITAMIN D 25 HYDROXY (VIT D DEFICIENCY, FRACTURES): VIT D 25 HYDROXY: 39 ng/mL (ref 30–100)

## 2014-11-27 NOTE — Progress Notes (Signed)
Reviewed personally.  M. Suzanne Layonna Dobie, MD.  

## 2014-11-29 LAB — IPS PAP TEST WITH HPV

## 2014-12-06 ENCOUNTER — Encounter: Payer: Self-pay | Admitting: Obstetrics & Gynecology

## 2014-12-06 ENCOUNTER — Ambulatory Visit (INDEPENDENT_AMBULATORY_CARE_PROVIDER_SITE_OTHER): Payer: BC Managed Care – PPO

## 2014-12-06 ENCOUNTER — Ambulatory Visit (INDEPENDENT_AMBULATORY_CARE_PROVIDER_SITE_OTHER): Payer: BC Managed Care – PPO | Admitting: Obstetrics & Gynecology

## 2014-12-06 VITALS — BP 118/78 | HR 88 | Wt 138.0 lb

## 2014-12-06 DIAGNOSIS — D251 Intramural leiomyoma of uterus: Secondary | ICD-10-CM

## 2014-12-06 DIAGNOSIS — N839 Noninflammatory disorder of ovary, fallopian tube and broad ligament, unspecified: Secondary | ICD-10-CM | POA: Diagnosis not present

## 2014-12-06 DIAGNOSIS — N852 Hypertrophy of uterus: Secondary | ICD-10-CM

## 2014-12-06 DIAGNOSIS — N838 Other noninflammatory disorders of ovary, fallopian tube and broad ligament: Secondary | ICD-10-CM

## 2014-12-06 NOTE — Progress Notes (Signed)
49 y.o. Singlefemale here for a pelvic ultrasound to recheck left ovarian cyst and to recheck endometrial thickness.  Pt reports she is doing very well on her OCP and this has made a big difference with her bleeding.  Very pleased with this.  Having some back pain.  Did have neurology evaluation and has pinched nerve in neck and back.  Has done PT which really helped her arm but not her low back/leg.  Still wonders if removal of uterus would make a difference.  However, with bleeding improvement, she is not interested in surgery.  As well, have advised it is very hard to know if surgery would help any or at all.     Patient's last menstrual period was 11/16/2014.  Sexually active:  no  Contraception: abstinence  FINDINGS: UTERUS: 9.0 x 6.3 x 5.1cm EMS: 7.8mm (was 58mm) ADNEXA:   Left ovary 2.2 x 1.7 x 1.1cm with 2 x 3 mm calcifcations.  Resolution of prior cyst noted.   Right ovary 2.4 x 2.9 x 2.7cm CUL DE SAC: no free fluid  Reviewed findings with pt.  She is very happy with improvement in bleeding with current OCPs.  As far as low back and right left issues are concerned, pt really not ineterested in pursuing anything for fibroid treatment.  Aware if symptoms change, of course we could repeat ultrasound and discuss options.  For now, pt will return for AEX in 1 year with Wonda Amis, and if there are new symptoms or change in exam, will plan to repeat PUS.  Pt voices understanding and is comfortable with plan.  Continue neurology follow up as needed.  Assessment:  Uterine fibroids, resolved 6cm left ovarian cyst Plan: Follow up for AEX with Melvia Heaps in July 2017.  ~15 minutes spent with patient >50% of time was in face to face discussion of above.

## 2015-02-10 ENCOUNTER — Encounter: Payer: Self-pay | Admitting: Obstetrics & Gynecology

## 2015-02-10 MED ORDER — NORETHIN-ETH ESTRAD-FE BIPHAS 1 MG-10 MCG / 10 MCG PO TABS: 1.0000 | ORAL_TABLET | Freq: Every day | ORAL | Status: DC

## 2015-05-23 ENCOUNTER — Other Ambulatory Visit: Payer: Self-pay | Admitting: Certified Nurse Midwife

## 2015-05-23 DIAGNOSIS — Z1231 Encounter for screening mammogram for malignant neoplasm of breast: Secondary | ICD-10-CM

## 2015-05-26 DIAGNOSIS — C50919 Malignant neoplasm of unspecified site of unspecified female breast: Secondary | ICD-10-CM

## 2015-05-26 HISTORY — DX: Malignant neoplasm of unspecified site of unspecified female breast: C50.919

## 2015-06-04 ENCOUNTER — Ambulatory Visit (HOSPITAL_BASED_OUTPATIENT_CLINIC_OR_DEPARTMENT_OTHER): Payer: BC Managed Care – PPO

## 2015-06-11 ENCOUNTER — Ambulatory Visit (HOSPITAL_BASED_OUTPATIENT_CLINIC_OR_DEPARTMENT_OTHER)
Admission: RE | Admit: 2015-06-11 | Discharge: 2015-06-11 | Disposition: A | Discharge status: Home / Self Care | Payer: BC Managed Care – PPO | Source: Ambulatory Visit | Attending: Certified Nurse Midwife | Admitting: Certified Nurse Midwife

## 2015-06-11 DIAGNOSIS — R921 Mammographic calcification found on diagnostic imaging of breast: Secondary | ICD-10-CM | POA: Diagnosis not present

## 2015-06-11 DIAGNOSIS — Z1231 Encounter for screening mammogram for malignant neoplasm of breast: Secondary | ICD-10-CM | POA: Insufficient documentation

## 2015-06-13 ENCOUNTER — Other Ambulatory Visit: Payer: Self-pay | Admitting: Certified Nurse Midwife

## 2015-06-13 DIAGNOSIS — R928 Other abnormal and inconclusive findings on diagnostic imaging of breast: Secondary | ICD-10-CM

## 2015-06-21 ENCOUNTER — Ambulatory Visit
Admission: RE | Admit: 2015-06-21 | Discharge: 2015-06-21 | Disposition: A | Discharge status: Home / Self Care | Payer: BC Managed Care – PPO | Source: Ambulatory Visit | Attending: Certified Nurse Midwife | Admitting: Certified Nurse Midwife

## 2015-06-21 DIAGNOSIS — R928 Other abnormal and inconclusive findings on diagnostic imaging of breast: Secondary | ICD-10-CM

## 2015-06-26 ENCOUNTER — Other Ambulatory Visit: Payer: Self-pay | Admitting: Certified Nurse Midwife

## 2015-06-26 DIAGNOSIS — R928 Other abnormal and inconclusive findings on diagnostic imaging of breast: Secondary | ICD-10-CM

## 2015-06-27 ENCOUNTER — Ambulatory Visit
Admission: RE | Admit: 2015-06-27 | Discharge: 2015-06-27 | Disposition: A | Discharge status: Home / Self Care | Payer: BC Managed Care – PPO | Source: Ambulatory Visit | Attending: Certified Nurse Midwife | Admitting: Certified Nurse Midwife

## 2015-06-27 ENCOUNTER — Ambulatory Visit: Payer: BC Managed Care – PPO

## 2015-06-27 DIAGNOSIS — R928 Other abnormal and inconclusive findings on diagnostic imaging of breast: Secondary | ICD-10-CM

## 2015-07-01 ENCOUNTER — Telehealth: Payer: Self-pay | Admitting: Family Medicine

## 2015-07-01 ENCOUNTER — Encounter: Payer: Self-pay | Admitting: Certified Nurse Midwife

## 2015-07-01 NOTE — Telephone Encounter (Signed)
Patient called and stated she would like to be referred to [place]. Lindon Romp, M.D Hematology and [facility] [place], [address], [address], [place], [place] [address] [phone]

## 2015-07-01 NOTE — Telephone Encounter (Signed)
Caller name: Jeani Hawking  Relation to pt: Nurse Navigator from the [facility]  Call back number: [phone]  Pharmacy:  Reason for call:  States she informed patient Friday of her biopsy results, patient spoke with some family members and any referrals placed for surgery, radiation, oncology she would like it to be in [facility]

## 2015-07-02 ENCOUNTER — Encounter: Payer: Self-pay | Admitting: Family Medicine

## 2015-07-02 DIAGNOSIS — C50512 Malignant neoplasm of lower-outer quadrant of left female breast: Secondary | ICD-10-CM

## 2015-07-03 NOTE — Telephone Encounter (Signed)
There is no referral in Epic, please advise

## 2015-07-03 NOTE — Telephone Encounter (Signed)
I placed this referral today.  

## 2015-07-03 NOTE — Telephone Encounter (Signed)
Order entered this morning

## 2015-07-03 NOTE — Telephone Encounter (Signed)
Awaiting call back from Dr Melin's nurse to see when she can work patient in, records have been faxed

## 2015-07-10 DIAGNOSIS — D0512 Intraductal carcinoma in situ of left breast: Secondary | ICD-10-CM | POA: Insufficient documentation

## 2015-07-31 ENCOUNTER — Telehealth: Payer: Self-pay | Admitting: Emergency Medicine

## 2015-07-31 NOTE — Telephone Encounter (Signed)
Dr. Quincy Simmonds,  Patient in mammogram hold for biopsy of L Breast completed at the [facility] on 06/27/15.   She has transferred her care to [facility] (reviewed in [place]) and is seeing Angelina Ok, MD and scheduled for  Lumpectomy on 08/01/2015. Okay to remove from hold?

## 2015-07-31 NOTE — Telephone Encounter (Signed)
Agree with need for pathology report.  Has annual exam with Evalee Mutton on 12/03/15.  OK to remove from mammogram hold as long as we have the ability to get the pathology report.

## 2015-07-31 NOTE — Telephone Encounter (Signed)
Yes, just will need to have pathology when surgery done

## 2015-09-09 NOTE — Telephone Encounter (Signed)
Pathology available from 08/01/15 in [place] from [facility].  Will close encounter.

## 2015-11-27 ENCOUNTER — Ambulatory Visit: Payer: BC Managed Care – PPO | Admitting: Certified Nurse Midwife

## 2015-12-02 ENCOUNTER — Telehealth: Payer: Self-pay | Admitting: Certified Nurse Midwife

## 2015-12-02 NOTE — Telephone Encounter (Signed)
Spoke with patient. Patient states that she has been discussing starting Tamoxifen with an oncologist, but would also like to discuss this with Melvia Heaps CNM. "I want to discuss the pros and cons and see what she thinks as well." Patient is scheduled for her aex on 12/12/2015 with Melvia Heaps CNM. Asking if she may discuss Tamoxifen at that appointment or if she will need to schedule another appointment. Advised I will speak with Melvia Heaps CNM and return call regarding appointment. She is agreeable.

## 2015-12-02 NOTE — Telephone Encounter (Signed)
Ok to discuss if given a little extra time for appointment

## 2015-12-02 NOTE — Telephone Encounter (Signed)
Patient has questions regarding a medication.

## 2015-12-03 ENCOUNTER — Ambulatory Visit: Payer: BC Managed Care – PPO | Admitting: Certified Nurse Midwife

## 2015-12-03 NOTE — Telephone Encounter (Signed)
Patient has 45 time slot for her aex. Spoke with patient advised she may discuss medication at her aex. If she has additional questions or needs additional time she is aware she will need to schedule a consult appointment to discuss medication further with Melvia Heaps CNM. She is agreeable.  Routing to provider for final review. Patient agreeable to disposition. Will close encounter.

## 2015-12-03 NOTE — Telephone Encounter (Signed)
Left message to call Kaitlyn at [phone]. 

## 2015-12-12 ENCOUNTER — Telehealth: Payer: Self-pay

## 2015-12-12 ENCOUNTER — Encounter: Payer: Self-pay | Admitting: Certified Nurse Midwife

## 2015-12-12 ENCOUNTER — Ambulatory Visit (INDEPENDENT_AMBULATORY_CARE_PROVIDER_SITE_OTHER): Payer: BC Managed Care – PPO | Admitting: Certified Nurse Midwife

## 2015-12-12 VITALS — BP 112/72 | HR 74 | Resp 16 | Ht 65.25 in | Wt 136.0 lb

## 2015-12-12 DIAGNOSIS — Z124 Encounter for screening for malignant neoplasm of cervix: Secondary | ICD-10-CM

## 2015-12-12 DIAGNOSIS — Z0000 Encounter for general adult medical examination without abnormal findings: Secondary | ICD-10-CM | POA: Diagnosis not present

## 2015-12-12 DIAGNOSIS — Z01419 Encounter for gynecological examination (general) (routine) without abnormal findings: Secondary | ICD-10-CM

## 2015-12-12 LAB — CBC
HCT: 40.4 % (ref 35.0–45.0)
HEMOGLOBIN: 13.9 g/dL (ref 11.7–15.5)
MCH: 31.4 pg (ref 27.0–33.0)
MCHC: 34.4 g/dL (ref 32.0–36.0)
MCV: 91.2 fL (ref 80.0–100.0)
MPV: 8.5 fL (ref 7.5–12.5)
PLATELETS: 265 10*3/uL (ref 140–400)
RBC: 4.43 MIL/uL (ref 3.80–5.10)
RDW: 12.5 % (ref 11.0–15.0)
WBC: 5.1 10*3/uL (ref 3.8–10.8)

## 2015-12-12 LAB — POCT URINALYSIS DIPSTICK
Bilirubin, UA: NEGATIVE
GLUCOSE UA: NEGATIVE
Ketones, UA: NEGATIVE
Leukocytes, UA: NEGATIVE
NITRITE UA: NEGATIVE
PROTEIN UA: NEGATIVE
RBC UA: NEGATIVE
UROBILINOGEN UA: NEGATIVE
pH, UA: 5

## 2015-12-12 LAB — LIPID PANEL
CHOLESTEROL: 151 mg/dL (ref 125–200)
HDL: 56 mg/dL (ref >=46)
LDL CALC: 84 mg/dL (ref <130)
TRIGLYCERIDES: 55 mg/dL (ref <150)
Total CHOL/HDL Ratio: 2.7 Ratio (ref <=5.0)
VLDL: 11 mg/dL (ref <30)

## 2015-12-12 LAB — TSH: TSH: 2.09 mIU/L

## 2015-12-12 LAB — HEMOGLOBIN, FINGERSTICK: HEMOGLOBIN, FINGERSTICK: 13.8 g/dL (ref 12.0–16.0)

## 2015-12-12 NOTE — Telephone Encounter (Signed)
Pt here for office visit & states she was told she could see her pathology from polyp removal from 2015 & pt states she was unable to view them. Pt to get copy from front office.

## 2015-12-12 NOTE — Patient Instructions (Signed)

## 2015-12-12 NOTE — Progress Notes (Signed)
6cm ovarian cyst resolved and endometrium decreased from 14 to 98mm with follow up ultrasound.  H/O uterine fibroids.  Ok to repeat PUS and recheck ovaries, endometrium, and fibroids.  If endometrium thickened, will consider endometrial biopsy.  Reviewed personally.  Felipa Emory, MD.

## 2015-12-12 NOTE — Progress Notes (Signed)
50 y.o. G0P0000 Single  Caucasian Fe here for annual exam.  Periods normal, no issues. Stopped OCP with diagnose of breast cancer picked with mammogram. Never sexually active. 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 Has decided not to do Tamoxifen. Concerned about endometrium thickened and should there be other screening? Had all genetic screening done due to family history of colon, ovarian and endometrial and all were negative. Feeling well has follow up with oncology in 9/17. Desires screening labs. Emotionally doing well. No other health issues today.  Patient's last menstrual period was 12/01/2015.          Sexually active: No.  The current method of family planning is abstinence.    Exercising: Yes.    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 She does not have any smokeless tobacco history on file. She reports that she does not drink alcohol or use illicit drugs.  Past Medical History  Diagnosis Date  . Arm laceration     3rd grade  . Allergy   . Anemia     Past Surgical History  Procedure Laterality Date  . Wisdom tooth extraction  2008  . Dilatation & curettage/hysteroscopy with trueclear N/A 12/11/2013    Procedure: DILATATION & CURETTAGE/HYSTEROSCOPY WITH TRUCLEAR;  Surgeon: Azalia Bilis, MD;  Location: [facility];  Service: Gynecology;  Laterality: N/A;    Current Outpatient Prescriptions  Medication Sig Dispense Refill  . acetaminophen (TYLENOL) 500 MG tablet Take 500 mg by mouth as needed.    . Cholecalciferol (D 1000) 1000 units capsule Take by  mouth.    . cyclobenzaprine (FLEXERIL) 5 MG tablet Take 1 tablet (5 mg total) by mouth at bedtime. 30 tablet 5  . ibuprofen (ADVIL,MOTRIN) 200 MG tablet Take 200 mg by mouth every 6 (six) hours as needed.     No current facility-administered medications for this visit.    Family History  Problem Relation Age of Onset  . Hypertension Mother 20  . Cancer Mother 53    uterine cancer & lung cancer  . Hyperlipidemia Mother     Deceased  . Hypertension Father     47s  . Cancer Father     kidney  . Hyperlipidemia Father     Deceased  . Diabetes Maternal Grandmother   . Emphysema Maternal Grandfather     ROS:  Pertinent items are noted in HPI.  Otherwise, a comprehensive ROS was negative.  Exam:   BP 112/72 mmHg  Pulse 74  Resp 16  Ht 5' 5.25" (1.657 m)  Wt 136 lb (61.689 kg)  BMI 22.47 kg/m2  LMP 12/01/2015 Height: 5' 5.25" (165.7 cm) Ht Readings from Last 3 Encounters:  12/12/15 5' 5.25" (1.657 m)  11/23/14 5' 5.25" (1.657 m)  06/08/14 5\' 5"  (1.651 m)    General appearance: alert, cooperative and appears stated age Head: Normocephalic, without obvious abnormality, atraumatic Neck: no adenopathy, supple, symmetrical, trachea midline and thyroid normal to inspection and palpation Lungs: clear to auscultation bilaterally Breasts: normal appearance, no masses or tenderness, No nipple retraction or dimpling, No  nipple discharge or bleeding, No axillary or supraclavicular adenopathy radiation changes noted on aerola, nipple and skin of left breast. Heart: regular rate and rhythm Abdomen: soft, non-tender; no masses,  no organomegaly Extremities: extremities normal, atraumatic, no cyanosis or edema Skin: Skin color, texture, turgor normal. No rashes or lesions Lymph nodes: Cervical, supraclavicular, and axillary nodes normal. No abnormal inguinal nodes palpated Neurologic: Grossly normal   Pelvic: External genitalia:  no lesions              Urethra:  normal appearing  urethra with no masses, tenderness or lesions, slightly enlargement noted, history of fibroid              Bartholin's and Skene's: normal                 Vagina: normal appearing vagina with normal color and discharge, no lesions              Cervix: no cervical motion tenderness, no lesions and nulliparous appearance              Pap taken: Yes.   Bimanual Exam:  Uterus:  normal size, contour, position, consistency, mobility, non-tender, anteflexed and slight enlargement noted, history of fibroids              Adnexa: normal adnexa and no mass, fullness, tenderness               Rectovaginal: Confirms               Anus:  normal sphincter tone, no lesions  Chaperone present: yes  A:  Well Woman with normal exam  Contraception none, not sexually active  Recent Lumpectomy for DCIS on left breast with radiation, has chosen not to take Tamoxifen. Under follow up with Oncology. Genetic screening all negative.  Family history of uterine/lung cancer(mother 28) Ovarian PA(50's) kidney cancer dad 33's  Screening labs  P:   Reviewed health and wellness pertinent to exam  Discussed importance of follow up with oncology and exams. Patient aware and plans to do.  Discussed will review her chart with Dr. Sabra Heck regarding concerns of follow up with previous thickened endometrium and precious cyst, if she needs additional follow up in light of Breast cancer and will advise. Questions addressed. Patient consider other surgery with family history even genetic screening negative.  Lab: CBC, HIV, Hep C, Lipid panel, TSH, Vitamin D  Pap smear as above with HPVHR   counseled on breast self exam, mammography screening, adequate intake of calcium and vitamin D, diet and exercise  return annually or prn  An After Visit Summary was printed and given to the patient.

## 2015-12-13 ENCOUNTER — Other Ambulatory Visit: Payer: Self-pay | Admitting: Certified Nurse Midwife

## 2015-12-13 DIAGNOSIS — D259 Leiomyoma of uterus, unspecified: Secondary | ICD-10-CM

## 2015-12-13 LAB — IPS PAP TEST WITH REFLEX TO HPV

## 2015-12-13 LAB — VITAMIN D 25 HYDROXY (VIT D DEFICIENCY, FRACTURES): VIT D 25 HYDROXY: 38 ng/mL (ref 30–100)

## 2015-12-13 LAB — HEPATITIS C ANTIBODY: HCV Ab: NEGATIVE

## 2015-12-13 LAB — HIV ANTIBODY (ROUTINE TESTING W REFLEX): HIV: NONREACTIVE

## 2015-12-16 ENCOUNTER — Telehealth: Payer: Self-pay | Admitting: Certified Nurse Midwife

## 2015-12-16 NOTE — Telephone Encounter (Signed)
Spoke with patient in regards to benefits for ultrasound. Patient is agreeable. Patient is not able to schedule prior to 01/06/16, due to schedule and states will be out of town 12/25/15 through 01/05/16. Appointment has been scheduled for 01/09/16 with Dr Sabra Heck. Patient is aware for appointment date and time.   Routing to Dr Sabra Heck for final review

## 2015-12-24 ENCOUNTER — Telehealth: Payer: Self-pay | Admitting: Obstetrics & Gynecology

## 2015-12-24 NOTE — Telephone Encounter (Signed)
Spoke with patient. Patient states that she would like to move her PUS appointment up. Reports she started her menses on 12/19/2015 and was advised her PUS needed to be scheduled before cycle day 12. Asking if her PUS can be scheduled for 12/31/2015. Advised I will review scheduling recommendations with Dr.Milelr and return call. She is agreeable.   Dr.Miller, there are no corresponding OV slots with you to schedule PUS on 8/8. Please advise.

## 2015-12-24 NOTE — Telephone Encounter (Signed)
Patient has an ultrasound appointment [phone] and would to rescheduled to 12/31/15 if possible.

## 2015-12-24 NOTE — Telephone Encounter (Signed)
Forwarding to Gay Filler to see how we can work in pt.

## 2015-12-25 NOTE — Telephone Encounter (Signed)
Patient called regarding ultrasound appointment. Pelvic ultrasound ordered to assess endometrial thickness. Menses started earlier than expected this month. Patient will call back next month with cycle and schedule at that time. Aware to call as soon as cycle starts to facilitate appointment time that works for her schedule.   Routing to provider for final review. Patient agreeable to disposition. Will close encounter.

## 2016-01-09 ENCOUNTER — Other Ambulatory Visit: Payer: BC Managed Care – PPO

## 2016-01-09 ENCOUNTER — Other Ambulatory Visit: Payer: BC Managed Care – PPO | Admitting: Obstetrics & Gynecology

## 2016-01-21 ENCOUNTER — Telehealth: Payer: Self-pay | Admitting: Obstetrics & Gynecology

## 2016-01-21 NOTE — Telephone Encounter (Signed)
Patient called to advise she has started her cycle, as of 01/20/16 and as previously instructed, she needed to scheduled an ultrasound. Patient is scheduled for an ultrasound on 01/30/16 with Dr Sabra Heck. Patient is aware of date and time of appointment. Patient has no further concerns.  Routing to Dr Sabra Heck  cc: Lamont Snowball

## 2016-01-21 NOTE — Telephone Encounter (Signed)
Last month appointment rescheduled due to patient's cycle and rescheduling issues.  This will be cycle day 10 for patient based on LMP of 01-20-16.   Routing to Dr Wallis Mart for review.

## 2016-01-30 ENCOUNTER — Ambulatory Visit (INDEPENDENT_AMBULATORY_CARE_PROVIDER_SITE_OTHER): Payer: BC Managed Care – PPO

## 2016-01-30 ENCOUNTER — Ambulatory Visit (INDEPENDENT_AMBULATORY_CARE_PROVIDER_SITE_OTHER): Payer: BC Managed Care – PPO | Admitting: Obstetrics & Gynecology

## 2016-01-30 ENCOUNTER — Other Ambulatory Visit: Payer: Self-pay | Admitting: *Deleted

## 2016-01-30 VITALS — BP 118/82 | HR 96 | Resp 14 | Ht 65.25 in | Wt 135.0 lb

## 2016-01-30 DIAGNOSIS — N852 Hypertrophy of uterus: Secondary | ICD-10-CM

## 2016-01-30 DIAGNOSIS — D251 Intramural leiomyoma of uterus: Secondary | ICD-10-CM

## 2016-01-30 DIAGNOSIS — D259 Leiomyoma of uterus, unspecified: Secondary | ICD-10-CM | POA: Diagnosis not present

## 2016-01-30 NOTE — Progress Notes (Signed)
50 y.o. G0P0000 Single Caucasian female here for pelvic ultrasound due to h/o abnormal appearing left ovarian cyst and to recheck uterine fibroids.  Pt was started on OCPs last year but was diagnosed with DCIS and so these were stopped.  Bleeding has stayed about the same since stopping her OCPs and she's been very happy about his as the OCPs did really help.  Pt has undergone lumpectomy and radiation and has decided not to proceed with adjuvant therapy using tamoxifen.  Just doesn't want any other medication in her body.  Also, has family hx of endometrial cancer in her mother around age 72 so this concerned her with the Tamoxifen as well.  Pt reports she did have negative genetic testing.  Patient's last menstrual period was 01/20/2016.  Contraception: abstinence   Findings:  UTERUS: 9.3 x 6.3 x 6.0cm with 2.7 x 2.7cm, 2.9 x 4.3cm, 1.9 x 1.0cm, 1.4 x 1.6cm, and 1.9 x 2.2cm fibroids. Mild increase in average size of fibroids since 6/17. EMS: 8.11mm ADNEXA: Left ovary: 1.9 x 1.2 x 1.0cm       Right ovary: 2.1 x 1.5 x 1.7cm with normal appearing follicles. CUL DE SAC: no free fluid  Discussion:  Images compared to prior ultrasound pictures.  As bleeding is improved, will just plan to continue to watch.  Pt aware fibroids should decrease in size once she entered menopause which should be in the next couple of years.  She knows to call with any change in bleeding in regards to length of cycle and heaviness of flow.  All questions answered.  Will repeat PUS 1 year.  Assessment:  Uterine fibroids H/O DCIS s/p lumpectomy and radiation, declined tamoxifen therapy H/o endometrial cancer in her mother  Plan:  Repeat PUS in one year.  Order placed.  Can schedule after pt's AEX with Debbi Hollice Espy next year.  ~25 minutes spent with patient >50% of time was in face to face discussion of above.

## 2016-02-02 ENCOUNTER — Encounter: Payer: Self-pay | Admitting: Obstetrics & Gynecology

## 2016-12-15 ENCOUNTER — Ambulatory Visit (INDEPENDENT_AMBULATORY_CARE_PROVIDER_SITE_OTHER): Payer: BC Managed Care – PPO | Admitting: Certified Nurse Midwife

## 2016-12-15 ENCOUNTER — Other Ambulatory Visit (HOSPITAL_COMMUNITY)
Admission: RE | Admit: 2016-12-15 | Discharge: 2016-12-15 | Disposition: A | Discharge status: Home / Self Care | Payer: BC Managed Care – PPO | Source: Ambulatory Visit | Attending: Obstetrics & Gynecology | Admitting: Obstetrics & Gynecology

## 2016-12-15 ENCOUNTER — Encounter: Payer: Self-pay | Admitting: Certified Nurse Midwife

## 2016-12-15 VITALS — BP 110/64 | HR 68 | Resp 16 | Ht 65.0 in | Wt 135.0 lb

## 2016-12-15 DIAGNOSIS — Z124 Encounter for screening for malignant neoplasm of cervix: Secondary | ICD-10-CM | POA: Diagnosis not present

## 2016-12-15 DIAGNOSIS — Z0000 Encounter for general adult medical examination without abnormal findings: Secondary | ICD-10-CM

## 2016-12-15 DIAGNOSIS — Z86000 Personal history of in-situ neoplasm of breast: Secondary | ICD-10-CM

## 2016-12-15 DIAGNOSIS — Z01419 Encounter for gynecological examination (general) (routine) without abnormal findings: Secondary | ICD-10-CM | POA: Insufficient documentation

## 2016-12-15 DIAGNOSIS — D259 Leiomyoma of uterus, unspecified: Secondary | ICD-10-CM

## 2016-12-15 NOTE — Patient Instructions (Signed)

## 2016-12-15 NOTE — Addendum Note (Signed)
Addended by: Regina Eck on: 12/15/2016 08:11 PM   Modules accepted: Orders

## 2016-12-15 NOTE — Progress Notes (Signed)
51 y.o. G0P0000 Single  Caucasian Fe here for annual exam. Periods normal, but slight spotting at beginning and ending. Still seeing Oncology for breast follow up from breast cancer diagnosed in 2/17. Had lumpectomy in 3/17. Recent scan to see if changes, none noted. Has not seen Dr. Birdie Riddle PCP since she moved. Will need to re-establish with another PCP. Genetic screen with breast cancer, endometrial cancer all  Negative. Will not start on Tamoxifen until menopausal at this point. Finally feeling like herself again! No other health issues today. 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  Patient's last menstrual period was 11/26/2016 (exact date).          Sexually active: No.  The current method of family planning is abstinence.    Exercising: Yes.    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 5/18 pt to sign for release. Next mammogram in 9/18, will discuss with Oncology if needed at this point.  Self Breast exams: yes Colonoscopy:  none BMD:   none TDaP:  2009 Shingles: no Pneumonia: no Hep C and HIV: both neg 2017 Labs: none   reports that she has never smoked. She has never used smokeless tobacco. She reports that she does not drink alcohol or use drugs.  Past Medical History:  Diagnosis Date  . Allergy   . Anemia   . Arm laceration    3rd grade  . 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 BREAST LUMPECTOMY    . DILATATION & CURETTAGE/HYSTEROSCOPY WITH TRUECLEAR N/A 12/11/2013   Procedure: DILATATION & CURETTAGE/HYSTEROSCOPY WITH TRUCLEAR;  Surgeon: Azalia Bilis, MD;  Location: [facility];  Service: Gynecology;  Laterality: N/A;  . WISDOM TOOTH EXTRACTION  2008    Current Outpatient  Prescriptions  Medication Sig Dispense Refill  . acetaminophen (TYLENOL) 500 MG tablet Take 500 mg by mouth as needed.    . Cholecalciferol (D 1000) 1000 units capsule Take by mouth.    Marland Kitchen ibuprofen (ADVIL,MOTRIN) 200 MG tablet Take 200 mg by mouth every 6 (six) hours as needed.     No current facility-administered medications for this visit.     Family History  Problem Relation Age of Onset  . Hypertension Mother 60  . Cancer Mother 2       uterine cancer & lung cancer  . Hyperlipidemia Mother        Deceased  . Hypertension Father        3s  . Cancer Father        kidney  . Hyperlipidemia Father        Deceased  . Diabetes Maternal Grandmother   . Emphysema Maternal Grandfather     ROS:  Pertinent items are noted in HPI.  Otherwise, a comprehensive ROS was negative.  Exam:   BP 110/64   Pulse 68   Resp 16   Ht 5\' 5"  (1.651 m)   Wt 135 lb (61.2 kg)   LMP 11/26/2016 (Exact Date)   BMI 22.47 kg/m  Height: 5\' 5"  (165.1 cm) Ht Readings from Last 3 Encounters:  12/15/16 5\' 5"  (1.651 m)  01/30/16 5' 5.25" (1.657 m)  12/12/15 5' 5.25" (1.657 m)  General appearance: alert, cooperative and appears stated age Head: Normocephalic, without obvious abnormality, atraumatic Neck: no adenopathy, supple, symmetrical, trachea midline and thyroid normal to inspection and palpation Lungs: clear to auscultation bilaterally Breasts: normal appearance, no masses or tenderness, No nipple retraction or dimpling, No nipple discharge or bleeding, No axillary or supraclavicular adenopathy Heart: regular rate and rhythm Abdomen: soft, non-tender; no masses,  no organomegaly Extremities: extremities normal, atraumatic, no cyanosis or edema Skin: Skin color, texture, turgor normal. No rashes or lesions Lymph nodes: Cervical, supraclavicular, and axillary nodes normal. No abnormal inguinal nodes palpated Neurologic: Grossly normal   Pelvic: External genitalia:  no lesions               Urethra:  normal appearing urethra with no masses, tenderness or lesions              Bartholin's and Skene's: normal                 Vagina: normal appearing vagina with normal color and discharge, no lesions              Cervix: no cervical motion tenderness, no lesions and normal appearance, very posterios              Pap taken: Yes.   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 Genetic screening negative.  Enlarged uterus known fibroids no size change  Colonoscopy due, will call soon for referral,declines IFOB  Family history of endometrial and lung cancer mother diagnosed at age 64  Screening labs  P:   Reviewed health and wellness pertinent to exam  Discussed perimenopausal/menopause and etiology. Discussed bleeding profile changes, and need to advise if no period in 3 months. Given handout on menopause. Questions addressed.  Continue follow as indicated with Oncology  Warning signs with fibroids given and need to advise  Discussed importance and will advise when ready to schedule  Labs: CBC with diff, CMP, Lipid panel, TSH, Vitamin D  Pap smear: yes   counseled on breast self exam, mammography screening, adequate intake of calcium and vitamin D, diet and exercise  return annually or prn  An After Visit Summary was printed and given to the patient.

## 2016-12-16 ENCOUNTER — Telehealth: Payer: Self-pay | Admitting: Obstetrics & Gynecology

## 2016-12-16 LAB — CBC WITH DIFFERENTIAL/PLATELET
BASOS ABS: 0 10*3/uL (ref 0.0–0.2)
Basos: 0 %
EOS (ABSOLUTE): 0 10*3/uL (ref 0.0–0.4)
Eos: 1 %
HEMOGLOBIN: 14 g/dL (ref 11.1–15.9)
Hematocrit: 40.6 % (ref 34.0–46.6)
Immature Grans (Abs): 0 10*3/uL (ref 0.0–0.1)
Immature Granulocytes: 0 %
LYMPHS ABS: 1.6 10*3/uL (ref 0.7–3.1)
LYMPHS: 23 %
MCH: 31.2 pg (ref 26.6–33.0)
MCHC: 34.5 g/dL (ref 31.5–35.7)
MCV: 90 fL (ref 79–97)
MONOCYTES: 7 %
Monocytes Absolute: 0.5 10*3/uL (ref 0.1–0.9)
Neutrophils Absolute: 4.7 10*3/uL (ref 1.4–7.0)
Neutrophils: 69 %
PLATELETS: 295 10*3/uL (ref 150–379)
RBC: 4.49 x10E6/uL (ref 3.77–5.28)
RDW: 12.6 % (ref 12.3–15.4)
WBC: 6.7 10*3/uL (ref 3.4–10.8)

## 2016-12-16 LAB — COMPREHENSIVE METABOLIC PANEL
A/G RATIO: 1.7 (ref 1.2–2.2)
ALK PHOS: 69 IU/L (ref 39–117)
ALT: 11 IU/L (ref 0–32)
AST: 18 IU/L (ref 0–40)
Albumin: 4.3 g/dL (ref 3.5–5.5)
BUN / CREAT RATIO: 21 (ref 9–23)
BUN: 15 mg/dL (ref 6–24)
CHLORIDE: 101 mmol/L (ref 96–106)
CO2: 26 mmol/L (ref 20–29)
Calcium: 9.2 mg/dL (ref 8.7–10.2)
Creatinine, Ser: 0.73 mg/dL (ref 0.57–1.00)
GFR calc Af Amer: 111 mL/min/{1.73_m2} (ref >59)
GFR calc non Af Amer: 96 mL/min/{1.73_m2} (ref >59)
GLUCOSE: 77 mg/dL (ref 65–99)
Globulin, Total: 2.5 g/dL (ref 1.5–4.5)
POTASSIUM: 4.1 mmol/L (ref 3.5–5.2)
Sodium: 139 mmol/L (ref 134–144)
Total Protein: 6.8 g/dL (ref 6.0–8.5)

## 2016-12-16 LAB — LIPID PANEL
CHOL/HDL RATIO: 2.7 ratio (ref 0.0–4.4)
Cholesterol, Total: 164 mg/dL (ref 100–199)
HDL: 60 mg/dL (ref >39)
LDL Calculated: 85 mg/dL (ref 0–99)
Triglycerides: 94 mg/dL (ref 0–149)
VLDL Cholesterol Cal: 19 mg/dL (ref 5–40)

## 2016-12-16 LAB — TSH: TSH: 1.49 u[IU]/mL (ref 0.450–4.500)

## 2016-12-16 LAB — VITAMIN D 25 HYDROXY (VIT D DEFICIENCY, FRACTURES): VIT D 25 HYDROXY: 42.3 ng/mL (ref 30.0–100.0)

## 2016-12-16 NOTE — Telephone Encounter (Signed)
Thank you :)

## 2016-12-16 NOTE — Telephone Encounter (Signed)
Call placed to patient to review benefits for a recommended ultrasound. Left voicemail requesting a return call.

## 2016-12-16 NOTE — Telephone Encounter (Signed)
Patient returned call. Reviewed benefits for recommended follow up ultrasound. Patient understood and agreeable. Patient ready to schedule. Patient scheduled 01/28/17 with Dr Sabra Heck. Patient aware of date, arrival time and cancellation policy. No further questions.   Routing to Dr Sabra Heck   cc: Melvia Heaps, CNM

## 2016-12-17 LAB — CYTOLOGY - PAP: Diagnosis: NEGATIVE

## 2017-01-05 ENCOUNTER — Telehealth: Payer: Self-pay | Admitting: Obstetrics & Gynecology

## 2017-01-05 NOTE — Telephone Encounter (Signed)
Patient wants to reschedule her ultrasound appointment for 01/28/17

## 2017-01-05 NOTE — Telephone Encounter (Signed)
Returned call to patient. Patient requesting to reschedule Ultrasound for 02-11-17. Patient scheduled with Dr. Sabra Heck on 02-11-17 at 330pm.  Routing to provider for review.

## 2017-01-10 IMAGING — MG MM DIGITAL SCREENING BILAT W/ CAD
5 series · 5 of 5 positions shown · non-contrast
Comparison: Previous exam(s).

CLINICAL DATA: Screening.

EXAM:
DIGITAL SCREENING BILATERAL MAMMOGRAM WITH CAD

[L MLO]
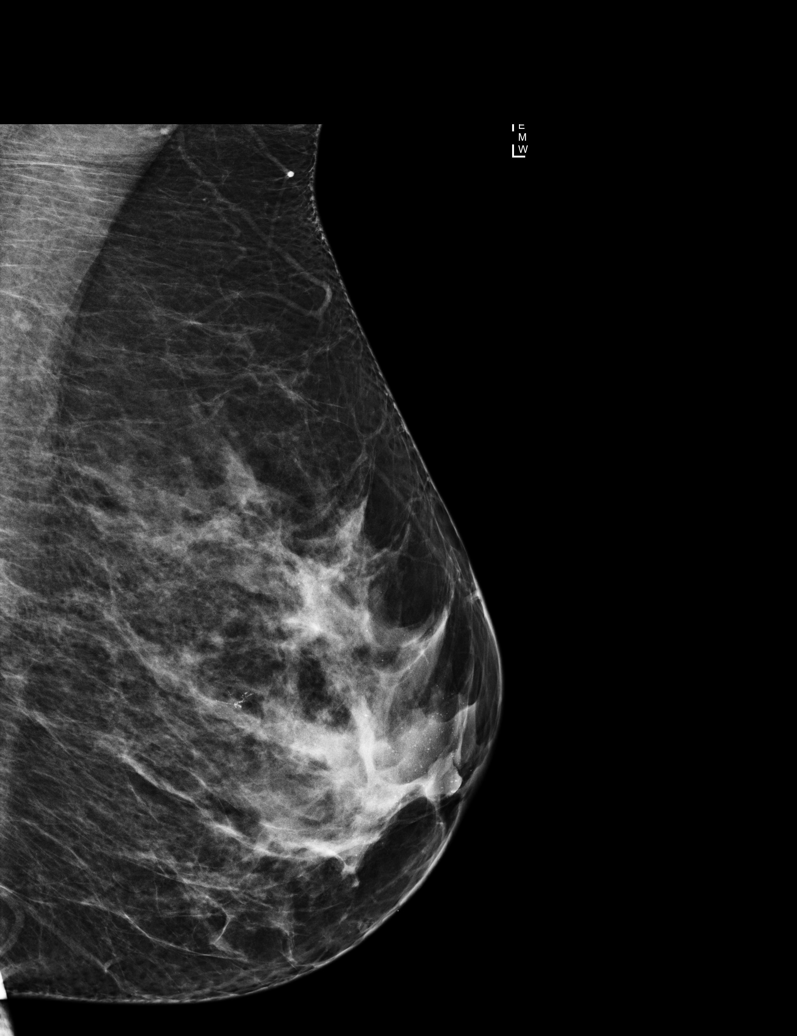

[R XCCL]
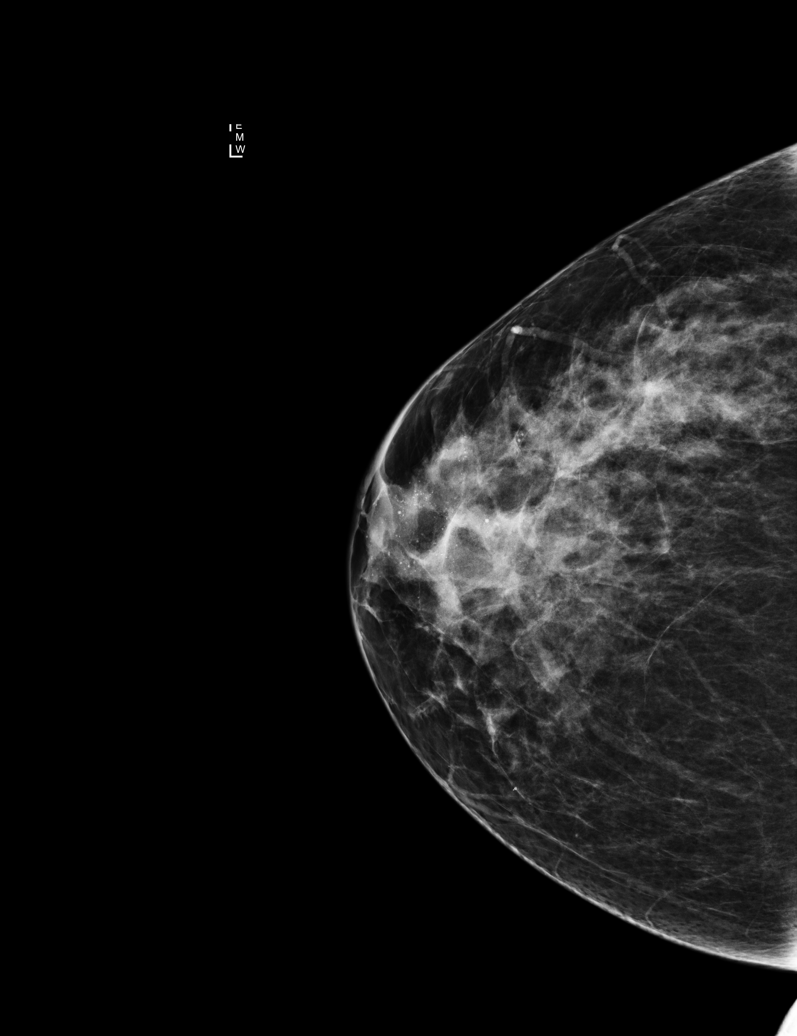

[R MLO]
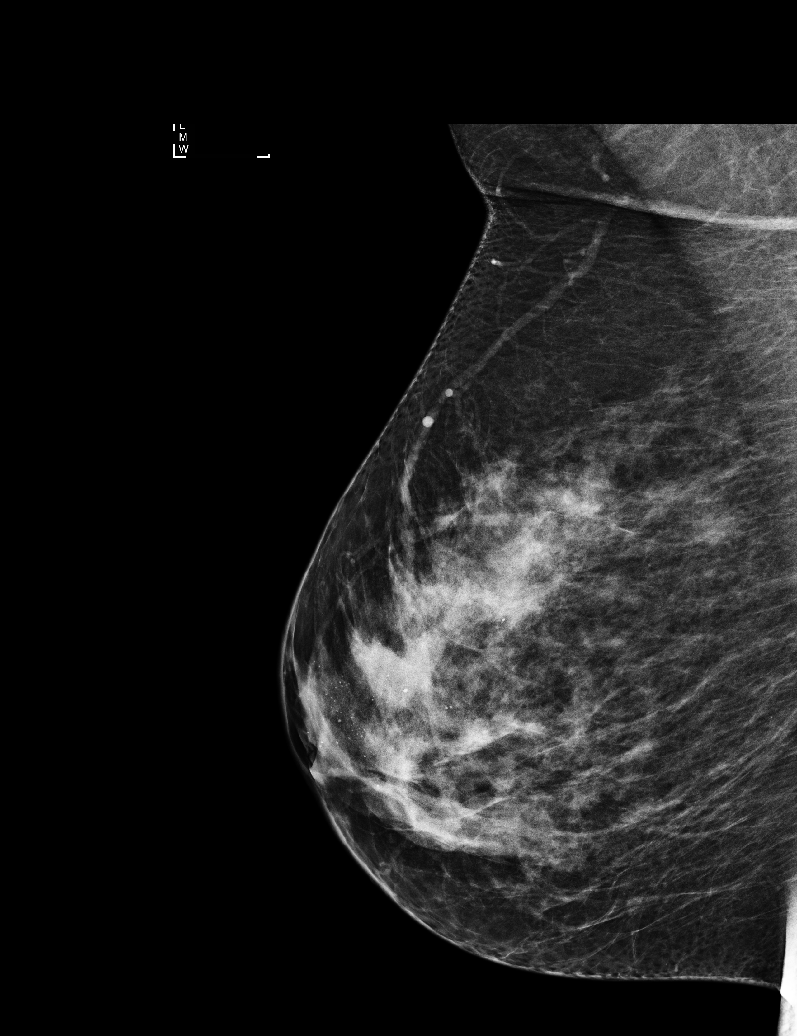

[R CC]
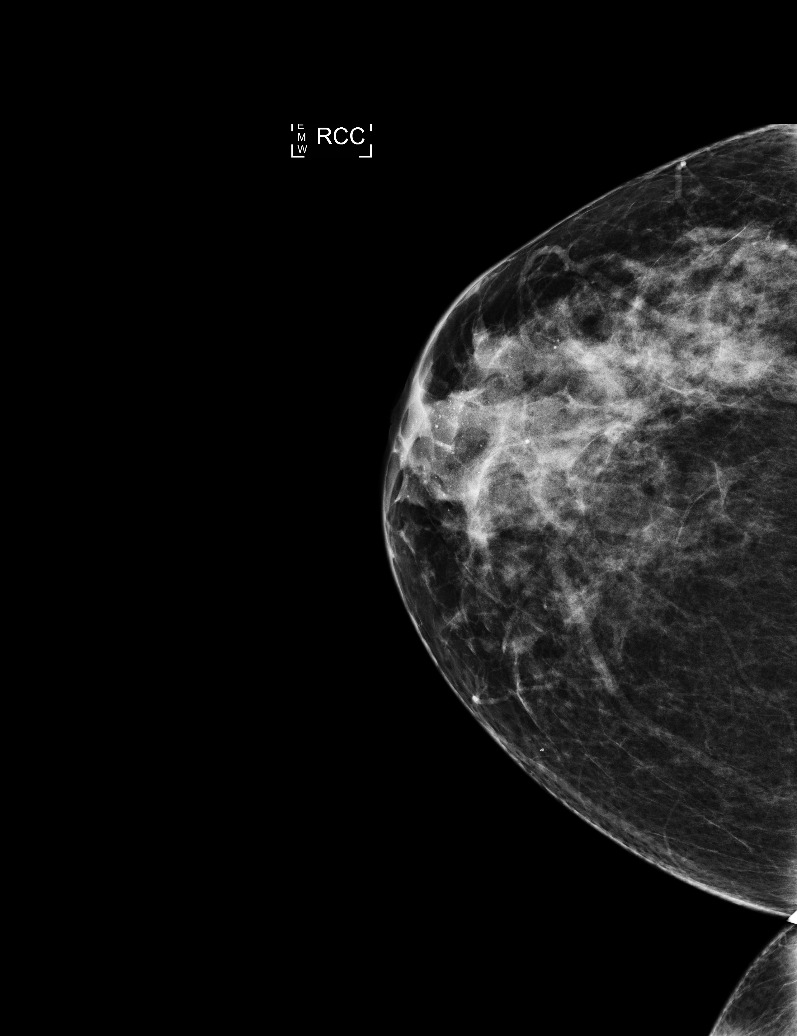

[L CC]
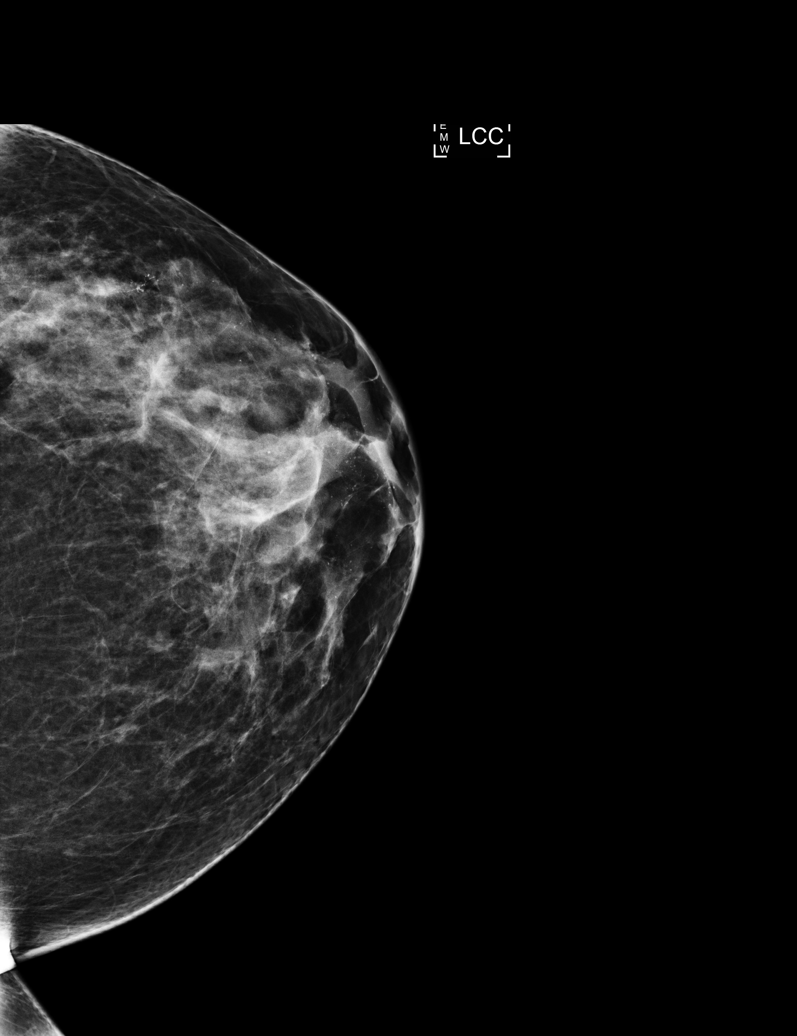

[5 of 5 positions shown; findings below may reference images not displayed]

ACR Breast Density Category b: There are scattered areas of
fibroglandular density.
FINDINGS: In the left breast, calcifications warrant further evaluation. In
the right breast, no findings suspicious for malignancy. Images were
processed with CAD.
IMPRESSION: Further evaluation is suggested for calcifications in the left
breast.

RECOMMENDATION:
Diagnostic mammogram of the left breast. (Code:[id number])

The patient will be contacted regarding the findings, and additional
imaging will be scheduled.

BI-RADS CATEGORY  0: Incomplete. Need additional imaging evaluation
and/or prior mammograms for comparison.

## 2017-01-20 IMAGING — MG MM DIGITAL DIAGNOSTIC UNILAT*L*
3 series · 3 of 3 positions shown · non-contrast
Comparison: Previous exam(s).

CLINICAL DATA: Screening recall for left breast calcifications.

EXAM:
DIGITAL DIAGNOSTIC LEFT MAMMOGRAM WITH CAD

[L CC]
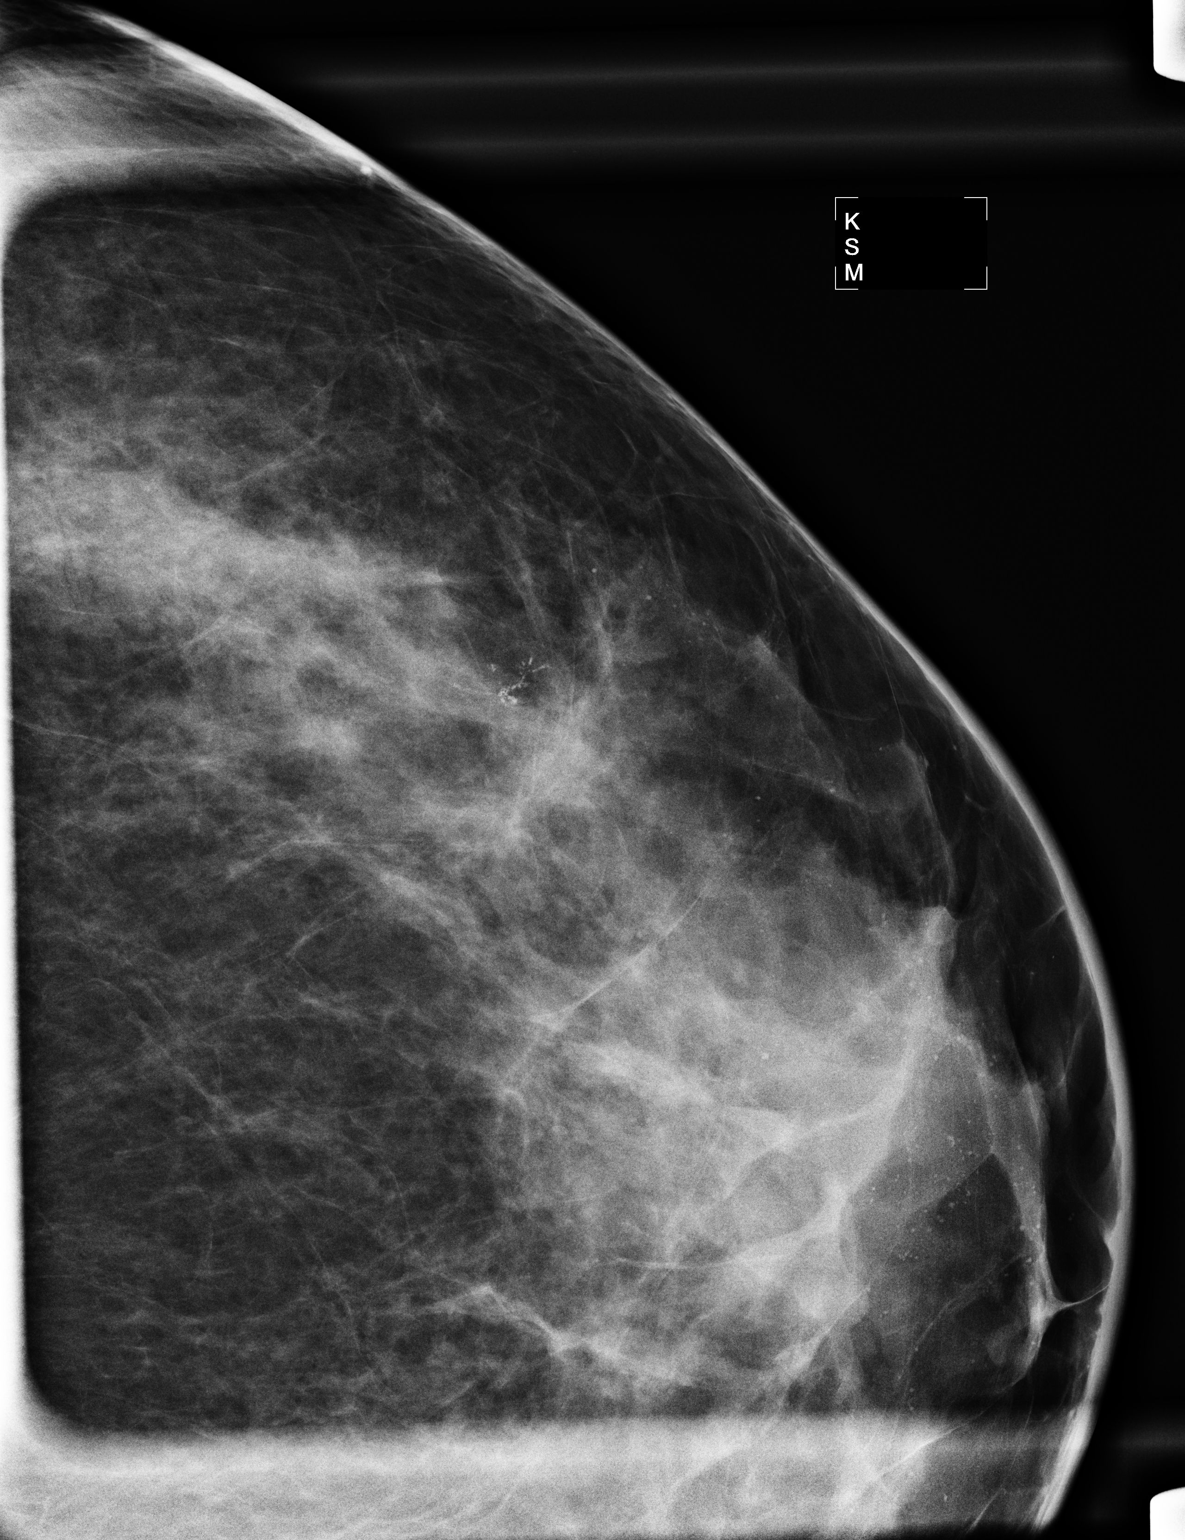

[L ML (1 of 2)]
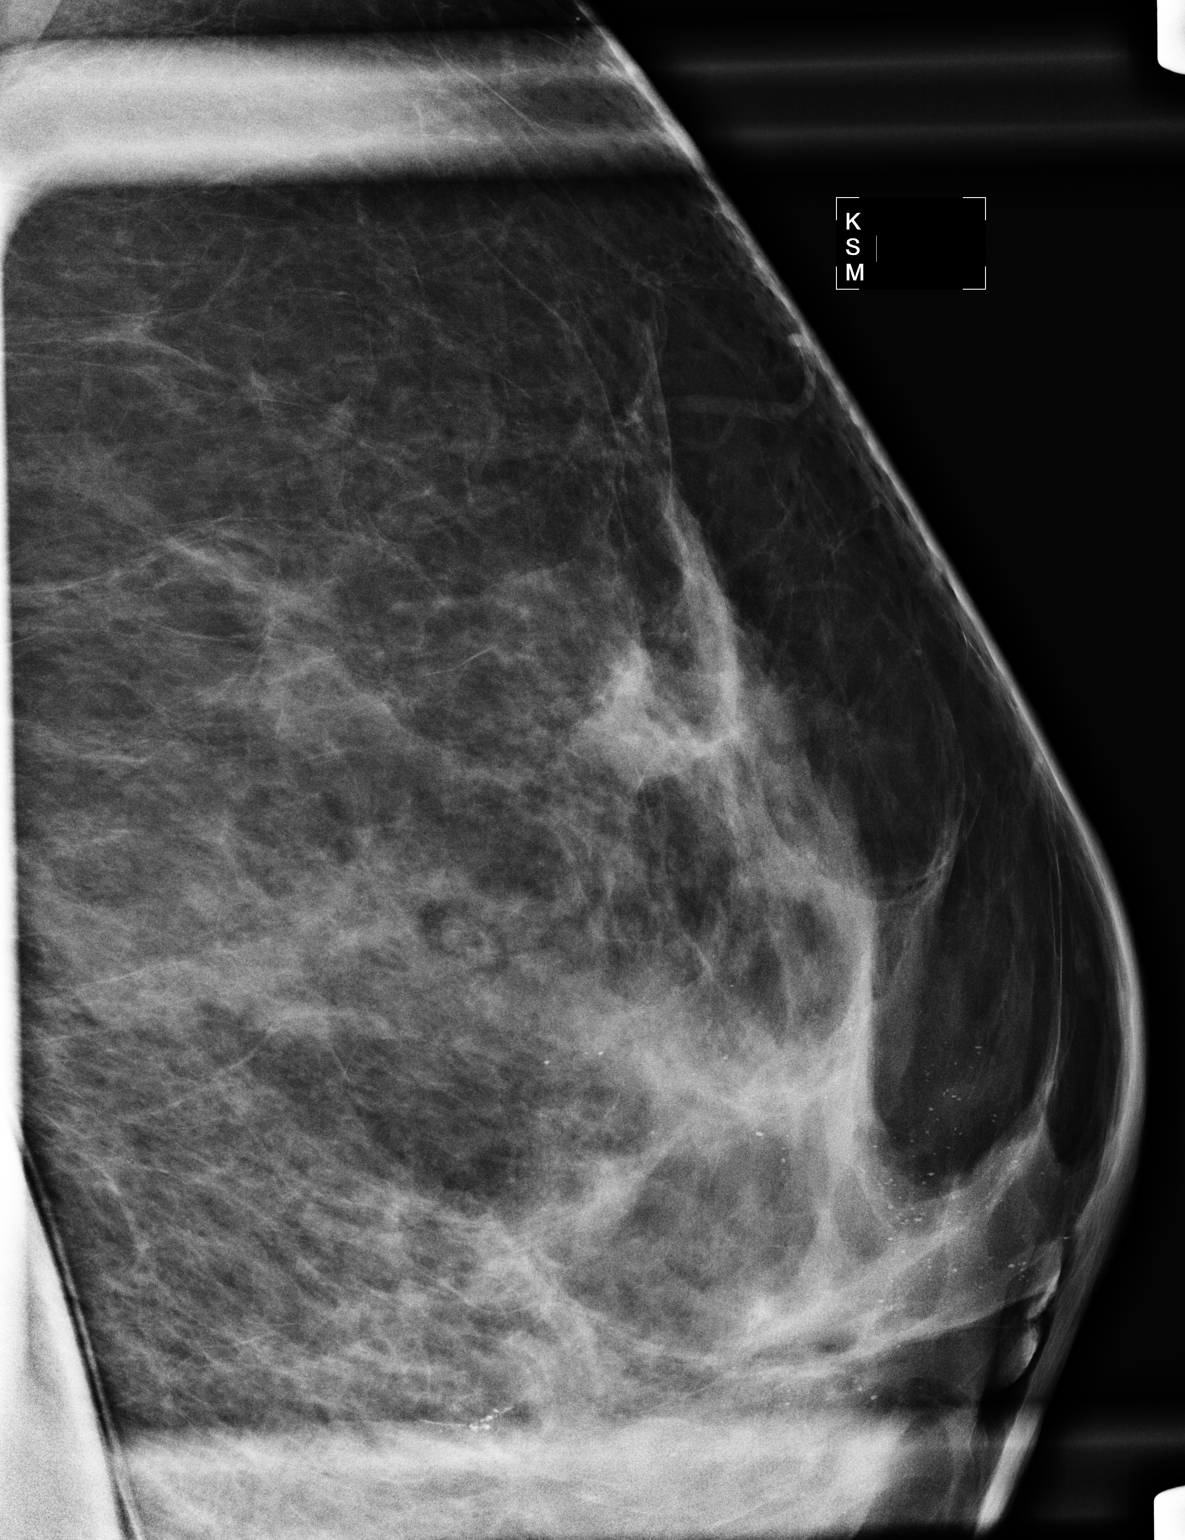

[L ML (2 of 2)]
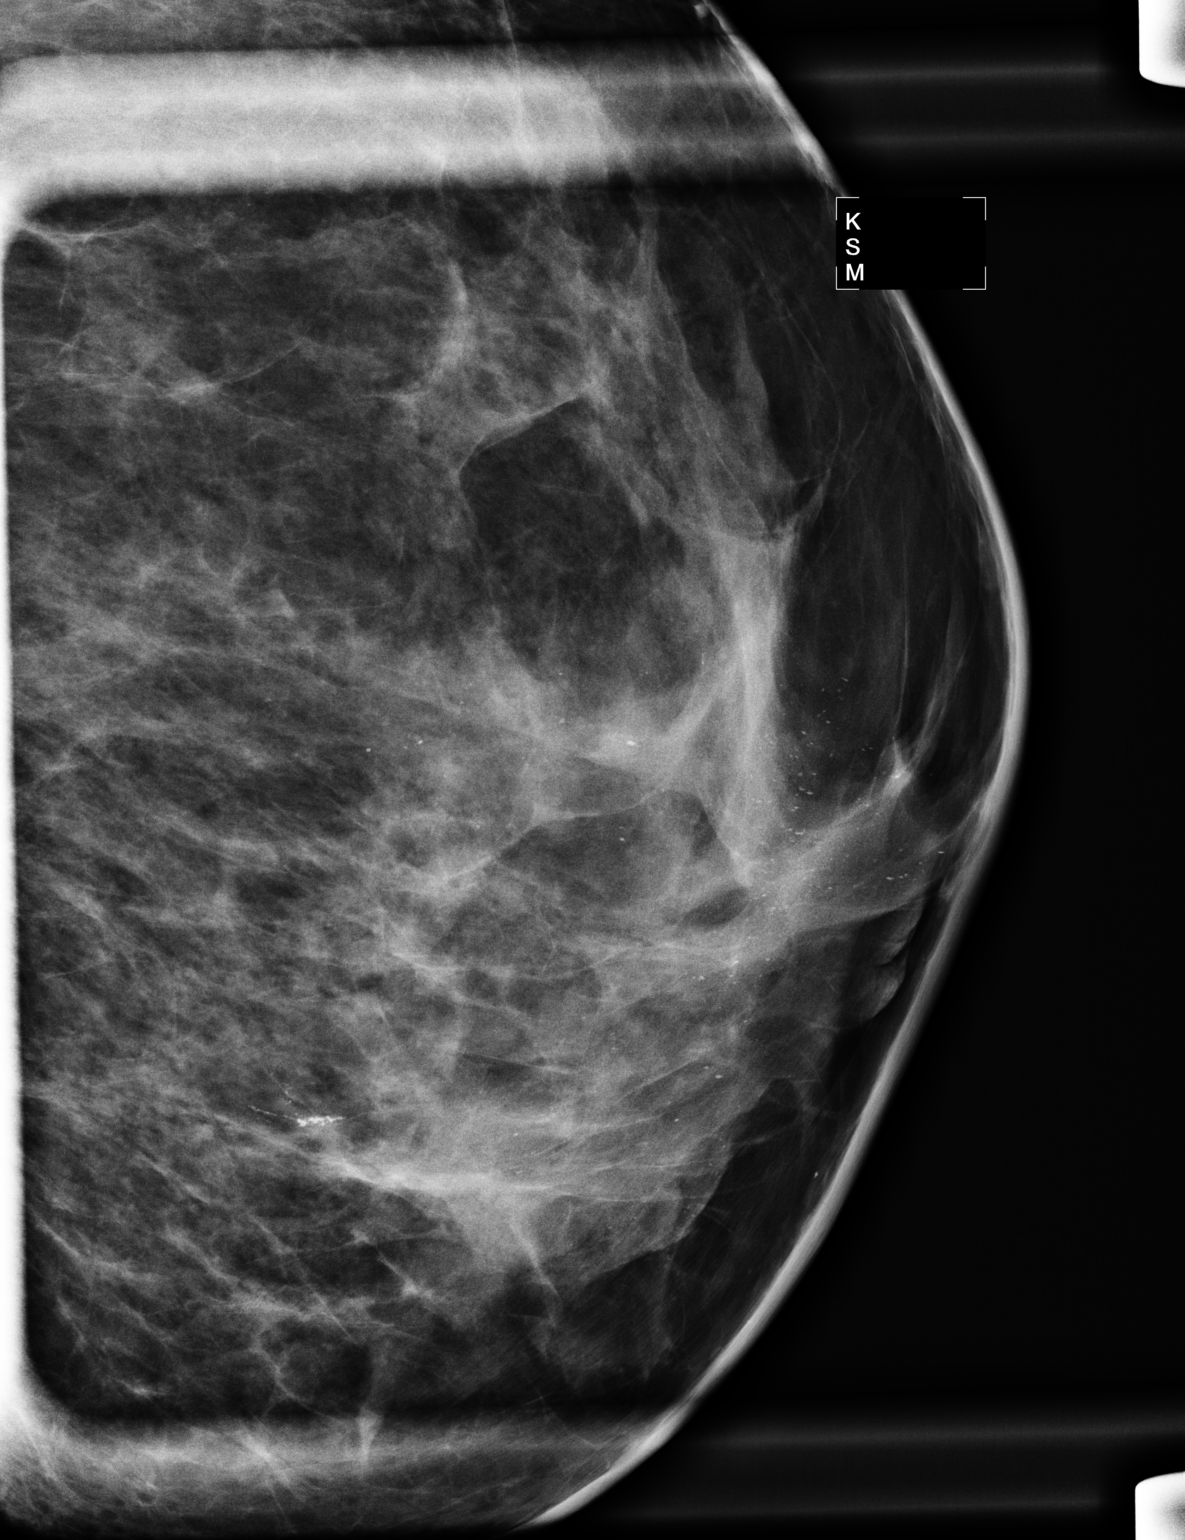

[3 of 3 positions shown; findings below may reference images not displayed]

ACR Breast Density Category c: The breast tissue is heterogeneously
dense, which may obscure small masses.
FINDINGS: In the central left breast, anterior depth there are multiple
calcifications which layer on the true lateral view, consistent with
benign milk of calcium. There is, however, also a 9 mm group of
calcifications in the lower outer left breast, middle depth, which
are arranged in a suspicious fine linear branching configuration.

Mammographic images were processed with CAD.
IMPRESSION: 1. The 9 mm group of fine linear branching to calcifications in the
lower outer left breast are suspicious, and warrant biopsy.

2. The calcifications in the central left breast, anterior depth are
consistent with benign milk of calcium.

RECOMMENDATION:
Stereotactic biopsy is recommended for the 9 mm group of fine linear
branching calcifications in the lower outer left breast. This has
been scheduled for 06/27/2015 at 9 a.m..

I have discussed the findings and recommendations with the patient.
Results were also provided in writing at the conclusion of the
visit. If applicable, a reminder letter will be sent to the patient
regarding the next appointment.

BI-RADS CATEGORY  4: Suspicious abnormality - biopsy should be
considered.

## 2017-01-28 ENCOUNTER — Other Ambulatory Visit: Payer: BC Managed Care – PPO

## 2017-01-28 ENCOUNTER — Other Ambulatory Visit: Payer: BC Managed Care – PPO | Admitting: Obstetrics & Gynecology

## 2017-02-09 ENCOUNTER — Telehealth: Payer: Self-pay | Admitting: Obstetrics & Gynecology

## 2017-02-09 NOTE — Telephone Encounter (Signed)
Patient is scheduled for an ultrasound on 02/11/17 and feels like her cycle is about to start. She wants to know if she will need to reschedule this appointment.

## 2017-02-09 NOTE — Telephone Encounter (Signed)
Spoke with patient. States she feels menses is getting ready to start, does PUS need to be rescheduled?  Advised patient can keep PUS as scheduled unless uncomfortable with PUS while on menses. Patient will keep PUS as scheduled with Dr. Sabra Heck.  Advised patient Dr. Sabra Heck will review, will return call with any additional recommendations, patient is agreeable.  Routing to provider for final review. Patient is agreeable to disposition. Will close encounter.   Cc: Melvia Heaps, CNM

## 2017-02-11 ENCOUNTER — Ambulatory Visit (INDEPENDENT_AMBULATORY_CARE_PROVIDER_SITE_OTHER): Payer: BC Managed Care – PPO | Admitting: Obstetrics & Gynecology

## 2017-02-11 ENCOUNTER — Encounter: Payer: Self-pay | Admitting: Obstetrics & Gynecology

## 2017-02-11 ENCOUNTER — Ambulatory Visit (INDEPENDENT_AMBULATORY_CARE_PROVIDER_SITE_OTHER): Payer: BC Managed Care – PPO

## 2017-02-11 VITALS — BP 128/66 | HR 86 | Resp 12 | Wt 137.0 lb

## 2017-02-11 DIAGNOSIS — D251 Intramural leiomyoma of uterus: Secondary | ICD-10-CM | POA: Diagnosis not present

## 2017-02-11 DIAGNOSIS — N852 Hypertrophy of uterus: Secondary | ICD-10-CM

## 2017-02-11 NOTE — Progress Notes (Signed)
GYNECOLOGY  VISIT  CC:   Ultrasound to recheck fibroids  HPI: 51 y.o. G0P0000 Single Caucasian female here for PUS to recheck uterine size.  Denies significant changes in sleeping.  Just "ready for menopause to get here".   Seen by Melvia Heaps, CNM.    H/O DCIS 2017.    Continues to desire no treatment unless I advised otherwise.  Uterus:  9.9 x 8.1 x 6.7cm with multiple fibroids, largest measuring 5.0 x 5.0cm Endometrium:  10.64mm (LMP 01/19/18) Left ovary:  1.7 x 1.5 x 1.3cm Right ovary:  1.7 x 2.0 x 1.5cm Cul de sac:  No free fluid  GYNECOLOGIC HISTORY: Patient's last menstrual period was 01/20/2017. Contraception: abstinance Menopausal hormone therapy: n/a  Patient Active Problem List   Diagnosis Date Noted  . Ductal carcinoma in situ of left breast 07/10/2015  . Paresthesia of right arm and leg 01/31/2014  . Family history of hyperlipidemia 01/31/2014  . Anemia 12/27/2013  . Enlarged uterus 11/20/2013    Class: History of    Past Medical History:  Diagnosis Date  . Allergy   . Anemia   . Arm laceration    3rd grade  . 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 BREAST LUMPECTOMY    . DILATATION & CURETTAGE/HYSTEROSCOPY WITH TRUECLEAR N/A 12/11/2013   Procedure: DILATATION & CURETTAGE/HYSTEROSCOPY WITH TRUCLEAR;  Surgeon: Azalia Bilis, MD;  Location: [facility];  Service: Gynecology;  Laterality: N/A;  . WISDOM TOOTH EXTRACTION  2008    MEDS:   Current Outpatient Prescriptions on File Prior to Visit  Medication Sig Dispense Refill  . acetaminophen (TYLENOL) 500 MG tablet Take 500 mg by mouth as needed.    . Cholecalciferol (D 1000) 1000 units capsule Take by mouth.    Marland Kitchen ibuprofen (ADVIL,MOTRIN) 200 MG tablet Take 200 mg by mouth every 6 (six) hours as needed.     No current facility-administered medications on file prior to visit.     ALLERGIES: Patient has no known allergies.  Family History  Problem  Relation Age of Onset  . Hypertension Mother 22  . Cancer Mother 86       uterine cancer & lung cancer  . Hyperlipidemia Mother        Deceased  . Hypertension Father        74s  . Cancer Father        kidney  . Hyperlipidemia Father        Deceased  . Diabetes Maternal Grandmother   . Emphysema Maternal Grandfather     SH:  Single, non smoker  Review of Systems  All other systems reviewed and are negative.   PHYSICAL EXAMINATION:    BP 128/66 (BP Location: Right Arm, Patient Position: Sitting, Cuff Size: Normal)   Pulse 86   Resp 12   Wt 137 lb (62.1 kg)   LMP 01/20/2017   BMI 22.80 kg/m     General appearance: alert, cooperative and appears stated age No exam performed today  Assessment: Known fibroid uterus, slightly enlarged today, o/w without symptoms  Plan: Pt is aware of precautions.  Will call with any concerns.  Otherwise, plan to repeat PUS 1 year.    ~15 minutes spent with patient >50% of time was in face to face discussion of above.

## 2017-02-14 DIAGNOSIS — D251 Intramural leiomyoma of uterus: Secondary | ICD-10-CM | POA: Insufficient documentation

## 2017-12-16 ENCOUNTER — Other Ambulatory Visit (HOSPITAL_COMMUNITY)
Admission: RE | Admit: 2017-12-16 | Discharge: 2017-12-16 | Disposition: A | Discharge status: Home / Self Care | Payer: BC Managed Care – PPO | Source: Ambulatory Visit | Attending: Obstetrics & Gynecology | Admitting: Obstetrics & Gynecology

## 2017-12-16 ENCOUNTER — Encounter: Payer: Self-pay | Admitting: Certified Nurse Midwife

## 2017-12-16 ENCOUNTER — Ambulatory Visit: Payer: BC Managed Care – PPO | Admitting: Certified Nurse Midwife

## 2017-12-16 VITALS — BP 110/64 | HR 68 | Resp 16 | Ht 65.25 in | Wt 137.0 lb

## 2017-12-16 DIAGNOSIS — Z01419 Encounter for gynecological examination (general) (routine) without abnormal findings: Secondary | ICD-10-CM

## 2017-12-16 DIAGNOSIS — Z23 Encounter for immunization: Secondary | ICD-10-CM

## 2017-12-16 DIAGNOSIS — E559 Vitamin D deficiency, unspecified: Secondary | ICD-10-CM | POA: Diagnosis not present

## 2017-12-16 DIAGNOSIS — Z0000 Encounter for general adult medical examination without abnormal findings: Secondary | ICD-10-CM

## 2017-12-16 DIAGNOSIS — Z124 Encounter for screening for malignant neoplasm of cervix: Secondary | ICD-10-CM | POA: Insufficient documentation

## 2017-12-16 NOTE — Progress Notes (Signed)
52 y.o. G0P0000 Single  Caucasian Fe here for annual exam. Periods still occurring more normal than last year. Lasting for 3-4 days now lighter with no cramping. frequent mammograms and oncology, but will be on yearly this next year. No medications needed for breast cancer.  Sees PCP prn only. Has planned to do colonoscopy this summer. Has now officially retired from teaching! No other health issues.   No LMP recorded.          Sexually active: No.  The current method of family planning is abstinence.    Exercising: Yes.    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 She has never used smokeless tobacco. She reports that she does not drink alcohol or use drugs.  Past Medical History:  Diagnosis Date  . Allergy   . Anemia   . Arm laceration    3rd grade  . 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 BREAST LUMPECTOMY    . DILATATION & CURETTAGE/HYSTEROSCOPY WITH TRUECLEAR N/A 12/11/2013   Procedure: DILATATION & CURETTAGE/HYSTEROSCOPY WITH TRUCLEAR;  Surgeon: Azalia Bilis, MD;  Location: [facility];  Service: Gynecology;  Laterality: N/A;  . WISDOM TOOTH EXTRACTION  2008    Current Outpatient Medications  Medication Sig Dispense Refill  . acetaminophen (TYLENOL) 500 MG tablet Take 500 mg by mouth as needed.    . Cholecalciferol (D 1000) 1000 units capsule Take by mouth.    Marland Kitchen ibuprofen (ADVIL,MOTRIN) 200 MG tablet Take 200 mg by mouth every 6 (six) hours as needed.    .  Magnesium Citrate 100 MG TABS      No current facility-administered medications for this visit.     Family History  Problem Relation Age of Onset  . Hypertension Mother 19  . Cancer Mother 57       uterine cancer & lung cancer  . Hyperlipidemia Mother        Deceased  . Hypertension Father        41s  . Cancer Father        kidney  . Hyperlipidemia Father        Deceased  . Diabetes Maternal Grandmother   . Emphysema Maternal Grandfather     ROS:  Pertinent items are noted in HPI.  Otherwise, a comprehensive ROS was negative.  Exam:   There were no vitals taken for this visit.   Ht Readings from Last 3 Encounters:  12/15/16 5\' 5"  (1.651 m)  01/30/16 5' 5.25" (1.657 m)  12/12/15 5' 5.25" (1.657 m)    General appearance: alert, cooperative and appears stated age Head: Normocephalic, without obvious abnormality, atraumatic Neck: no adenopathy, supple, symmetrical, trachea midline and thyroid normal to inspection and palpation Lungs: clear to auscultation bilaterally Breasts: normal appearance, no masses or tenderness, No nipple retraction or dimpling, No nipple discharge or bleeding, No axillary or supraclavicular adenopathy, lumpectomy scarring noted Heart: regular rate and rhythm Abdomen: soft, non-tender; no masses,  no organomegaly Extremities: extremities normal, atraumatic, no cyanosis or edema Skin: Skin color, texture, turgor normal. No rashes or lesions Lymph nodes: Cervical, supraclavicular, and axillary nodes normal. No abnormal inguinal nodes palpated Neurologic: Grossly normal   Pelvic: External genitalia:  no lesions,  Normal female              Urethra:  normal appearing urethra with no masses, tenderness or lesions              Bartholin's and Skene's: normal                 Vagina: normal appearing vagina with normal color and discharge, no lesions              Cervix: no bleeding following Pap, no cervical motion tenderness and no lesions               Pap taken: Yes.   Bimanual Exam:  Uterus:  enlarged, 10 weeks size, nodular, known fibroids, no size change              Adnexa: normal adnexa and no mass, fullness, tenderness               Rectovaginal: Confirms               Anus:  normal sphincter tone, no lesions  Chaperone present: yes  A:  Well Woman with normal exam  Contraception none needed, not sexually active  History of Breast cancer DCIS, now on yearly follow up with mammogram and oncology follow up.  TDAP due  Colonoscopy due  Screening labs  P:   Reviewed health and wellness pertinent to exam  Discussed importance of SBE, mammogram and oncology follow up. Patient aware and will continue as indicated.  Request TDAP  Scheduling colonoscopy this summer  Labs:CMP,Lipid panel, CBC, TSH, Vitamin D  Pap smear: yes   counseled on breast self exam, mammography screening, adequate intake of calcium and vitamin D, diet and exercise  return annually or prn  An After Visit Summary was printed and given to the patient.

## 2017-12-16 NOTE — Patient Instructions (Signed)

## 2017-12-17 LAB — COMPREHENSIVE METABOLIC PANEL
A/G RATIO: 1.4 (ref 1.2–2.2)
ALBUMIN: 4.2 g/dL (ref 3.5–5.5)
ALK PHOS: 61 IU/L (ref 39–117)
ALT: 12 IU/L (ref 0–32)
AST: 17 IU/L (ref 0–40)
BILIRUBIN TOTAL: 0.3 mg/dL (ref 0.0–1.2)
BUN / CREAT RATIO: 19 (ref 9–23)
BUN: 12 mg/dL (ref 6–24)
CHLORIDE: 102 mmol/L (ref 96–106)
CO2: 27 mmol/L (ref 20–29)
Calcium: 8.9 mg/dL (ref 8.7–10.2)
Creatinine, Ser: 0.64 mg/dL (ref 0.57–1.00)
GFR calc non Af Amer: 104 mL/min/{1.73_m2} (ref >59)
GFR, EST AFRICAN AMERICAN: 119 mL/min/{1.73_m2} (ref >59)
Globulin, Total: 2.9 g/dL (ref 1.5–4.5)
Glucose: 90 mg/dL (ref 65–99)
POTASSIUM: 4.5 mmol/L (ref 3.5–5.2)
Sodium: 141 mmol/L (ref 134–144)
TOTAL PROTEIN: 7.1 g/dL (ref 6.0–8.5)

## 2017-12-17 LAB — TSH: TSH: 1.28 u[IU]/mL (ref 0.450–4.500)

## 2017-12-17 LAB — CBC
Hematocrit: 40.9 % (ref 34.0–46.6)
Hemoglobin: 13.3 g/dL (ref 11.1–15.9)
MCH: 29.4 pg (ref 26.6–33.0)
MCHC: 32.5 g/dL (ref 31.5–35.7)
MCV: 91 fL (ref 79–97)
Platelets: 336 10*3/uL (ref 150–450)
RBC: 4.52 x10E6/uL (ref 3.77–5.28)
RDW: 12.9 % (ref 12.3–15.4)
WBC: 6.9 10*3/uL (ref 3.4–10.8)

## 2017-12-17 LAB — LIPID PANEL
CHOLESTEROL TOTAL: 180 mg/dL (ref 100–199)
Chol/HDL Ratio: 3.2 ratio (ref 0.0–4.4)
HDL: 57 mg/dL (ref >39)
LDL Calculated: 104 mg/dL — ABNORMAL HIGH (ref 0–99)
TRIGLYCERIDES: 95 mg/dL (ref 0–149)
VLDL Cholesterol Cal: 19 mg/dL (ref 5–40)

## 2017-12-17 LAB — CYTOLOGY - PAP: DIAGNOSIS: NEGATIVE

## 2017-12-17 LAB — VITAMIN D 25 HYDROXY (VIT D DEFICIENCY, FRACTURES): Vit D, 25-Hydroxy: 40.8 ng/mL (ref 30.0–100.0)

## 2018-01-21 ENCOUNTER — Telehealth: Payer: Self-pay | Admitting: Emergency Medicine

## 2018-01-21 NOTE — Telephone Encounter (Signed)
Ok to transfer. 

## 2018-01-21 NOTE — Telephone Encounter (Signed)
Please advise ok to transfer.

## 2018-01-21 NOTE — Telephone Encounter (Signed)
Please advise ok to transfer care, please advise.

## 2018-01-21 NOTE — Telephone Encounter (Signed)
Copied from [place] [id number]. Topic: Quick Communication - See Telephone Encounter >> Jan 21, 2018 12:15 PM Antonieta Iba C wrote: CRM for notification. See Telephone encounter for: 01/21/18.   Pt has not seen provider Tabori in a while since she transitioned to her new location at [facility]. Pt would like to see Dr. Deborra Medina.

## 2018-01-25 NOTE — Telephone Encounter (Signed)
Patient scheduled for 05/11/18.

## 2018-01-25 NOTE — Telephone Encounter (Signed)
Okay with me 

## 2018-05-11 ENCOUNTER — Encounter: Payer: Self-pay | Admitting: Family Medicine

## 2018-05-11 ENCOUNTER — Ambulatory Visit (INDEPENDENT_AMBULATORY_CARE_PROVIDER_SITE_OTHER): Payer: BC Managed Care – PPO | Admitting: Family Medicine

## 2018-05-11 VITALS — BP 116/80 | HR 72 | Temp 97.6°F | Ht 65.25 in | Wt 139.8 lb

## 2018-05-11 DIAGNOSIS — D0512 Intraductal carcinoma in situ of left breast: Secondary | ICD-10-CM

## 2018-05-11 DIAGNOSIS — D509 Iron deficiency anemia, unspecified: Secondary | ICD-10-CM

## 2018-05-11 DIAGNOSIS — Z1211 Encounter for screening for malignant neoplasm of colon: Secondary | ICD-10-CM

## 2018-05-11 DIAGNOSIS — Z0000 Encounter for general adult medical examination without abnormal findings: Secondary | ICD-10-CM | POA: Insufficient documentation

## 2018-05-11 DIAGNOSIS — Z83438 Family history of other disorder of lipoprotein metabolism and other lipidemia: Secondary | ICD-10-CM | POA: Diagnosis not present

## 2018-05-11 NOTE — Assessment & Plan Note (Addendum)
Reviewed preventive care protocols, scheduled due services, and updated immunizations Discussed nutrition, exercise, diet, and healthy lifestyle.  GI referral placed for screening colonoscopy.

## 2018-05-11 NOTE — Assessment & Plan Note (Signed)
Followed by oncology. S/p lumpectomy and radiation. Tamoxifen deferred because her family history of uterine CA. She is now on yearly surveillance with mammograms.

## 2018-05-11 NOTE — Patient Instructions (Signed)
It was great to meet you. Have a happy holiday!  We will call you with you GI appointment.

## 2018-05-11 NOTE — Progress Notes (Signed)
Subjective:   Patient ID: Betty Huber, female    DOB: 10-25-1965, 52 y.o.   MRN: [id number]  Betty Huber is a pleasant 52 y.o. year old female who presents to clinic today with Transfer of Care (Patient is here today to tranfer care from Dr. Birdie Huber to Dr. Deborra Huber.  She declines flu shot.  She would like a referral to GI for a Colonoscopy.  On 7.25.19 she saw her GYN and had a PAP;Vit-D/TSH/CMP/CBC/Lipid completed and all were WNL.  She sees Dr. Fransisca Connors Huber at [facility] for her Breast CA that is now in remission.)  on 05/11/2018  HPI:  Here to establish care from Dr. Birdie Huber and for CPX.  Recently retired [employer].  She has never had a colonoscopy but she is interested in a  referral to GI for a screening Colonoscopy.  Saw her GYN, Betty Huber on 12/16/17- note reviewed. She had a pap smear, Vit-D/TSH/CMP/CBC/Lipid completed and all were WNL.  Sees dermatology yearly- was just seen October 2019.  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  Tamoxifen deferred due to family history of uterine cancer (mother).  She sees Dr. Fransisca Connors Huber at [facility] for her Breast CA that is now in remission. Last saw her 10/06/17 and had mammogram that day- note reviewed.  Advised follow up in 09/2018.    Current Outpatient Medications on File Prior to Visit  Medication  Sig Dispense Refill  . Cholecalciferol (D 1000) 1000 units capsule Take by mouth.     No current facility-administered medications on file prior to visit.     No Known Allergies  Past Medical History:  Diagnosis Date  . Allergy   . Anemia   . Arm laceration    3rd grade  . Breast cancer ([place]) 2017   DCIS    Past Surgical History:  Procedure Laterality Date  . BREAST LUMPECTOMY    . DILATATION & CURETTAGE/HYSTEROSCOPY WITH TRUECLEAR N/A 12/11/2013   Procedure: DILATATION & CURETTAGE/HYSTEROSCOPY WITH TRUCLEAR;  Surgeon: Betty Bilis, MD;  Location: [facility];  Service: Gynecology;  Laterality: N/A;  . WISDOM TOOTH EXTRACTION  2008    Family History  Problem Relation Age of Onset  . Hypertension Mother 52  . Cancer Mother 55       uterine cancer & lung cancer  . Hyperlipidemia Mother        Deceased  . COPD Mother   . Hearing loss Mother   . Hypertension Father        68s  . Cancer Father        kidney  . Hyperlipidemia Father        Deceased  . Arthritis Father   . Asthma  Father   . Hearing loss Father   . Diabetes Maternal Grandmother   . ADD / ADHD Maternal Grandmother   . Emphysema Maternal Grandfather   . COPD Maternal Grandfather   . Heart disease Maternal Grandfather   . Heart attack Maternal Grandfather   . Heart attack Paternal Grandmother   . Heart attack Paternal Grandfather     Social History   Socioeconomic History  . Marital status: Single    Spouse name: Not on file  . Number of children: Not on file  . Years of education: Not on file  . Highest education level: Not on file  Occupational History  . Not on file  Social Needs  . Financial resource strain: Not on file  . Food insecurity:    Worry: Not on file    Inability: Not on file  . Transportation needs:    Medical: Not on file    Non-medical: Not on file  Tobacco Use  . Smoking status: Never Smoker  . Smokeless tobacco: Never Used  Substance and Sexual Activity  . Alcohol use:  No    Alcohol/week: 0.0 standard drinks  . Drug use: No  . Sexual activity: Not Currently    Partners: Male    Birth control/protection: Abstinence  Lifestyle  . Physical activity:    Days per week: Not on file    Minutes per session: Not on file  . Stress: Not on file  Relationships  . Social connections:    Talks on phone: Not on file    Gets together: Not on file    Attends religious service: Not on file    Active member of club or organization: Not on file    Attends meetings of clubs or organizations: Not on file    Relationship status: Not on file  . Intimate partner violence:    Fear of current or ex partner: Not on file    Emotionally abused: Not on file    Physically abused: Not on file    Forced sexual activity: Not on file  Other Topics Concern  . 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   She lives alone, no children.   Highest level of education:  B.S.   The PMH, PSH, Social History, Family History, Medications, and allergies have been reviewed in [facility], and have been updated if relevant.   Review of Systems  Constitutional: Negative.   HENT: Negative.   Eyes: Negative.   Respiratory: Negative.   Cardiovascular: Negative.   Gastrointestinal: Negative.   Endocrine: Negative.   Genitourinary: Negative.   Musculoskeletal: Negative.   Skin: Negative.   Allergic/Immunologic: Negative.   Neurological: Negative.   Hematological: Negative.   Psychiatric/Behavioral: Negative.   All other systems reviewed and are negative.      Objective:    BP 116/80 (BP Location: Left Arm, Patient Position: Sitting, Cuff Size: Normal)   Pulse 72   Temp 97.6 F (36.4 C) (Oral)   Ht 5' 5.25" (1.657 m)   Wt 139 lb 12.8 oz (63.4 kg)   LMP 04/11/2018   SpO2 100%   BMI 23.09 kg/m    Physical Exam   General:  Well-developed,well-nourished,in no acute distress; alert,appropriate and cooperative throughout  examination Head:  normocephalic and atraumatic.   Eyes:  vision grossly intact, PERRL Ears:  R ear normal and L ear normal externally, TMs clear bilaterally Nose:  no external deformity.   Mouth:  good dentition.   Neck:  No deformities, masses, or tenderness noted. Lungs:  Normal respiratory effort, chest expands symmetrically. Lungs are clear to auscultation, no crackles or wheezes. Heart:  Normal rate and regular rhythm. S1 and S2 normal without gallop, murmur, click, rub or other extra sounds. Abdomen:  Bowel sounds positive,abdomen soft and non-tender without masses, organomegaly or hernias noted. Msk:  No deformity or scoliosis noted of thoracic or lumbar spine.   Extremities:  No clubbing, cyanosis, edema, or deformity noted with normal full range of motion of all joints.   Neurologic:  alert & oriented X3 and gait normal.   Skin:  Intact without suspicious lesions or rashes Cervical Nodes:  No lymphadenopathy noted Axillary Nodes:  No palpable lymphadenopathy Psych:  Cognition and judgment appear intact. Alert and cooperative with normal attention span and concentration. No apparent delusions, illusions, hallucinations        Assessment & Plan:   Ductal carcinoma in situ of left breast  Family history of hyperlipidemia  Iron deficiency anemia, unspecified iron deficiency anemia type  Screening for colon cancer - Plan: Ambulatory referral to Gastroenterology No follow-ups on file.

## 2018-06-05 DIAGNOSIS — Z1211 Encounter for screening for malignant neoplasm of colon: Secondary | ICD-10-CM

## 2018-06-06 ENCOUNTER — Telehealth: Payer: Self-pay | Admitting: Certified Nurse Midwife

## 2018-06-06 NOTE — Telephone Encounter (Signed)
Returned message via mychart:   Ms. Shafran,  It looks like your PCP, Dr. Deborra Medina placed a referral for you for colonoscopy with [facility]. They should be contacting you to schedule an appointment, from the notes it looks like they left you a voicemail on the 1/102020 to call.You may also reach them at [phone].   Sometimes our office refers to Dr. Collene Mares as she is a female provider but outside of the [facility]. Her office number is [phone] you can call to schedule an appointment at her office as well. You should not need a referral but I will place one just in case.   Referral placed for Dr. Collene Mares.   Cc [employer] .  Will close encounter.

## 2018-06-06 NOTE — Telephone Encounter (Signed)
Patient sent the following correspondence through [facility]. Routing to triage to assist patient with request.  Hello. :) Hope you are doing well! I was contacting you because we had previously discussed a recommendation that you had for a doctor to perform a colonoscopy. I wanted to schedule one, but I could not remember the name of the doctor you recommended. Also, is that something I initiate or is that something your office would need to do a referral for?     Thank you so much and I hope you have a great week! :)     Laurice Record   [email or web address]  [phone]

## 2018-06-10 ENCOUNTER — Encounter: Payer: Self-pay | Admitting: Gastroenterology

## 2018-06-23 ENCOUNTER — Ambulatory Visit (AMBULATORY_SURGERY_CENTER): Payer: Self-pay

## 2018-06-23 ENCOUNTER — Encounter: Payer: Self-pay | Admitting: Gastroenterology

## 2018-06-23 VITALS — Ht 65.0 in | Wt 139.8 lb

## 2018-06-23 DIAGNOSIS — Z1211 Encounter for screening for malignant neoplasm of colon: Secondary | ICD-10-CM

## 2018-06-23 MED ORDER — NA SULFATE-K SULFATE-MG SULF 17.5-3.13-1.6 GM/177ML PO SOLN: 1.0000 | Freq: Once | ORAL | 0 refills | Status: AC

## 2018-06-23 NOTE — Progress Notes (Signed)
Denies allergies to eggs or soy products. Denies complication of anesthesia or sedation. Denies use of weight loss medication. Denies use of O2.   Emmi instructions declined.   Patient was given a 15.00 coupon for Suprep/

## 2018-07-05 ENCOUNTER — Ambulatory Visit (AMBULATORY_SURGERY_CENTER): Payer: BC Managed Care – PPO | Admitting: Gastroenterology

## 2018-07-05 ENCOUNTER — Encounter: Payer: Self-pay | Admitting: Gastroenterology

## 2018-07-05 VITALS — BP 142/77 | HR 78 | Temp 98.6°F | Resp 12 | Ht 65.0 in | Wt 139.0 lb

## 2018-07-05 DIAGNOSIS — Z1211 Encounter for screening for malignant neoplasm of colon: Secondary | ICD-10-CM | POA: Diagnosis present

## 2018-07-05 DIAGNOSIS — D128 Benign neoplasm of rectum: Secondary | ICD-10-CM

## 2018-07-05 DIAGNOSIS — D129 Benign neoplasm of anus and anal canal: Secondary | ICD-10-CM

## 2018-07-05 DIAGNOSIS — K621 Rectal polyp: Secondary | ICD-10-CM

## 2018-07-05 MED ORDER — SODIUM CHLORIDE 0.9 % IV SOLN: 500.0000 mL | Freq: Once | INTRAVENOUS | Status: DC

## 2018-07-05 NOTE — Op Note (Signed)
[place] Patient Name: Betty Huber Procedure Date: 07/05/2018 10:06 AM MRN: [id number] Endoscopist: Thornton Park MD, MD Age: 53 Referring MD:  Date of Birth: 1966-02-25 Gender: Female Account #: [id number] Procedure:                Colonoscopy Indications:              Screening for colorectal malignant neoplasm, Colon                            cancer screening in patient at increased risk:                            Family history of colon polyps in multiple                            1st-degree relatives (mother and father, age of                            diagnosis and histology unknown), This is the                            patient's first colonoscopy. No baseline GI                            symptoms. Medicines:                See the Anesthesia note for documentation of the                            administered medications Procedure:                Pre-Anesthesia Assessment:                           - Prior to the procedure, a History and Physical                            was performed, and patient medications and                            allergies were reviewed. The patient's tolerance of                            previous anesthesia was also reviewed. The risks                            and benefits of the procedure and the sedation                            options and risks were discussed with the patient.                            All questions were answered, and informed consent  was obtained. Prior Anticoagulants: The patient has                            taken no previous anticoagulant or antiplatelet                            agents. ASA Grade Assessment: II - A patient with                            mild systemic disease. After reviewing the risks                            and benefits, the patient was deemed in                            satisfactory condition to undergo the procedure.          After obtaining informed consent, the colonoscope                            was passed under direct vision. Throughout the                            procedure, the patient's blood pressure, pulse, and                            oxygen saturations were monitored continuously. The                            Colonoscope was introduced through the anus and                            advanced to the the terminal ileum, with                            identification of the appendiceal orifice and IC                            valve. The colonoscopy was performed without                            difficulty. The patient tolerated the procedure                            well. The quality of the bowel preparation was                            good. The terminal ileum, ileocecal valve,                            appendiceal orifice, and rectum were photographed. Scope In: 10:12:03 AM Scope Out: 10:25:07 AM Scope Withdrawal Time: 0 hours 10 minutes 49 seconds  Total Procedure Duration: 0 hours 13 minutes 4 seconds  Findings:  The perianal and digital rectal examinations were                            normal.                           A 2 mm polyp was found in the recto-sigmoid colon.                            The polyp was sessile. The polyp was removed with a                            cold biopsy forceps. Resection and retrieval were                            complete. Estimated blood loss was minimal.                           The exam was otherwise without abnormality on                            direct and retroflexion views. Complications:            No immediate complications. Estimated blood loss:                            Minimal. Estimated Blood Loss:     Estimated blood loss was minimal. Impression:               - One 2 mm polyp at the recto-sigmoid colon,                            removed with a cold biopsy forceps. Resected and                             retrieved.                           - The examination was otherwise normal on direct                            and retroflexion views. Recommendation:           - Discharge patient to home.                           - Resume regular diet.                           - Continue present medications.                           - Await pathology results.                           - Repeat colonoscopy in 5 years for surveillance  regardless of the pathology given the family                            history of polyps. Thornton Park MD, MD 07/05/2018 10:30:00 AM This report has been signed electronically.

## 2018-07-05 NOTE — Progress Notes (Signed)
Called to room to assist during endoscopic procedure.  Patient ID and intended procedure confirmed with present staff. Received instructions for my participation in the procedure from the performing physician.  

## 2018-07-05 NOTE — Patient Instructions (Signed)
YOU HAD AN ENDOSCOPIC PROCEDURE TODAY AT THE [facility]:   Refer to the procedure report that was given to you for any specific questions about what was found during the examination.  If the procedure report does not answer your questions, please call your gastroenterologist to clarify.  If you requested that your care partner not be given the details of your procedure findings, then the procedure report has been included in a sealed envelope for you to review at your convenience later.  YOU SHOULD EXPECT: Some feelings of bloating in the abdomen. Passage of more gas than usual.  Walking can help get rid of the air that was put into your GI tract during the procedure and reduce the bloating. If you had a lower endoscopy (such as a colonoscopy or flexible sigmoidoscopy) you may notice spotting of blood in your stool or on the toilet paper. If you underwent a bowel prep for your procedure, you may not have a normal bowel movement for a few days.  Please Note:  You might notice some irritation and congestion in your nose or some drainage.  This is from the oxygen used during your procedure.  There is no need for concern and it should clear up in a day or so.  SYMPTOMS TO REPORT IMMEDIATELY:   Following lower endoscopy (colonoscopy or flexible sigmoidoscopy):  Excessive amounts of blood in the stool  Significant tenderness or worsening of abdominal pains  Swelling of the abdomen that is new, acute  Fever of 100F or higher  For urgent or emergent issues, a gastroenterologist can be reached at any hour by calling [phone].   DIET:  We do recommend a small meal at first, but then you may proceed to your regular diet.  Drink plenty of fluids but you should avoid alcoholic beverages for 24 hours.  ACTIVITY:  You should plan to take it easy for the rest of today and you should NOT DRIVE or use heavy machinery until tomorrow (because of the sedation medicines used during the test).     FOLLOW UP: Our staff will call the number listed on your records the next business day following your procedure to check on you and address any questions or concerns that you may have regarding the information given to you following your procedure. If we do not reach you, we will leave a message.  However, if you are feeling well and you are not experiencing any problems, there is no need to return our call.  We will assume that you have returned to your regular daily activities without incident.  If any biopsies were taken you will be contacted by phone or by letter within the next 1-3 weeks.  Please call us at [phone] if you have not heard about the biopsies in 3 weeks.   Await for biopsy results Polyps (handout given)    SIGNATURES/CONFIDENTIALITY: You and/or your care partner have signed paperwork which will be entered into your electronic medical record.  These signatures attest to the fact that that the information above on your After Visit Summary has been reviewed and is understood.  Full responsibility of the confidentiality of this discharge information lies with you and/or your care-partner. 

## 2018-07-05 NOTE — Progress Notes (Signed)
Pt's states no medical or surgical changes since previsit or office visit. 

## 2018-07-05 NOTE — Progress Notes (Signed)
Spontaneous respirations throughout. VSS. Resting comfortably. To PACU on room air. Report to  RN. 

## 2018-07-06 ENCOUNTER — Telehealth: Payer: Self-pay

## 2018-07-06 NOTE — Telephone Encounter (Signed)
No answer, left message to call back later today, B.Khushboo Chuck RN. 

## 2018-07-06 NOTE — Telephone Encounter (Signed)
  Follow up Call-  Call back number 07/05/2018  Post procedure Call Back phone  # [phone]  Permission to leave phone message Yes  Some recent data might be hidden     Patient questions:  Do you have a fever, pain , or abdominal swelling? No. Pain Score  0 *  Have you tolerated food without any problems? Yes.    Have you been able to return to your normal activities? Yes.    Do you have any questions about your discharge instructions: Diet   No. Medications  No. Follow up visit  No.  Do you have questions or concerns about your Care? No.  Actions: * If pain score is 4 or above: No action needed, pain <4.

## 2018-07-09 ENCOUNTER — Encounter: Payer: Self-pay | Admitting: Gastroenterology

## 2018-12-21 ENCOUNTER — Ambulatory Visit: Payer: BC Managed Care – PPO | Admitting: Certified Nurse Midwife

## 2019-01-11 ENCOUNTER — Ambulatory Visit: Payer: BC Managed Care – PPO | Admitting: Certified Nurse Midwife

## 2019-01-11 ENCOUNTER — Other Ambulatory Visit (HOSPITAL_COMMUNITY)
Admission: RE | Admit: 2019-01-11 | Discharge: 2019-01-11 | Disposition: A | Discharge status: Home / Self Care | Payer: BC Managed Care – PPO | Source: Ambulatory Visit | Attending: Certified Nurse Midwife | Admitting: Certified Nurse Midwife

## 2019-01-11 ENCOUNTER — Encounter: Payer: Self-pay | Admitting: Certified Nurse Midwife

## 2019-01-11 ENCOUNTER — Other Ambulatory Visit: Payer: Self-pay

## 2019-01-11 VITALS — BP 106/68 | HR 70 | Temp 97.1°F | Resp 16 | Ht 65.0 in | Wt 140.0 lb

## 2019-01-11 DIAGNOSIS — Z01419 Encounter for gynecological examination (general) (routine) without abnormal findings: Secondary | ICD-10-CM

## 2019-01-11 DIAGNOSIS — Z124 Encounter for screening for malignant neoplasm of cervix: Secondary | ICD-10-CM | POA: Diagnosis not present

## 2019-01-11 DIAGNOSIS — Z86018 Personal history of other benign neoplasm: Secondary | ICD-10-CM

## 2019-01-11 DIAGNOSIS — Z853 Personal history of malignant neoplasm of breast: Secondary | ICD-10-CM | POA: Diagnosis not present

## 2019-01-11 DIAGNOSIS — N951 Menopausal and female climacteric states: Secondary | ICD-10-CM

## 2019-01-11 NOTE — Progress Notes (Signed)
53 y.o. G0P0000 Single  Caucasian Fe here for annual exam.Periods are changing, 21 to 40 days cycle interval with bleeding small to moderate. Minimal cramping. Having hot flashes and night sweats, some insomnia, but no issues. Keeping menstrual calendar. Continues with oncology follow up of left breast cancer. Chose not to take oral medication. All normal at this point with last mammogram. Labs with oncology and PCP. Had colonoscopy with one polyp benign. Not sexually active. No other health issues today.  Patient's last menstrual period was 12/18/2018 (exact date).          Sexually active: No.  The current method of family planning is abstinence.    Exercising: Yes.    walking, workout at home Smoker:  no  Review of Systems  Constitutional: Negative.   HENT: Negative.   Eyes: Negative.   Respiratory: Negative.   Cardiovascular: Negative.   Gastrointestinal: Negative.   Genitourinary: Negative.   Musculoskeletal: Negative.   Skin: Negative.   Neurological: Negative.   Endo/Heme/Allergies: Negative.   Psychiatric/Behavioral: Negative.     Health Maintenance: Pap:  12-15-16 neg, 12-16-17 neg History of Abnormal Pap: no MMG:  2017 core biopsy done after mammo, intermediate grade ductal carcinoma in situ with comedonecrosis and associated calcifications in left breast Under follow up with Oncology2019 neg per patient, 10-06-17 category b density birads 2:neg, per patient 2020 mmg done & neg Self Breast exams: yes Colonoscopy:  2020 BMD:   none TDaP:  2019 Shingles: no Pneumonia: no Hep C and HIV: both neg 2017 Labs: if needed   reports that she has never smoked. She has never used smokeless tobacco. She reports that she does not drink alcohol or use drugs.  Past Medical History:  Diagnosis Date  . Allergy   . Anemia   . Arm laceration    3rd grade  . Breast cancer ([place]) 2017   DCIS    Past Surgical History:  Procedure Laterality Date  . BREAST LUMPECTOMY    . DILATATION  & CURETTAGE/HYSTEROSCOPY WITH TRUECLEAR N/A 12/11/2013   Procedure: DILATATION & CURETTAGE/HYSTEROSCOPY WITH TRUCLEAR;  Surgeon: Azalia Bilis, MD;  Location: [facility];  Service: Gynecology;  Laterality: N/A;  . WISDOM TOOTH EXTRACTION  2008    Current Outpatient Medications  Medication Sig Dispense Refill  . acetaminophen (TYLENOL) 325 MG tablet Take 650 mg by mouth every 6 (six) hours as needed.    . Cholecalciferol (D 1000) 1000 units capsule Take by mouth.    . naproxen sodium (ALEVE) 220 MG tablet Take 220 mg by mouth.     No current facility-administered medications for this visit.     Family History  Problem Relation Age of Onset  . Hypertension Mother 98  . Cancer Mother 16       uterine cancer & lung cancer  . Hyperlipidemia Mother        Deceased  . COPD Mother   . Hearing loss Mother   . Hypertension Father        40s  . Cancer Father        kidney  . Hyperlipidemia Father        Deceased  . Arthritis Father   . Asthma Father   . Hearing loss Father   . Diabetes Maternal Grandmother   . ADD / ADHD Maternal Grandmother   . Emphysema Maternal Grandfather   . COPD Maternal Grandfather   . Heart disease Maternal Grandfather   . Heart attack Maternal Grandfather   . Heart  attack Paternal Grandmother   . Heart attack Paternal Grandfather   . Colon cancer Neg Hx   . Esophageal cancer Neg Hx   . Rectal cancer Neg Hx   . Stomach cancer Neg Hx     ROS:  Pertinent items are noted in HPI.  Otherwise, a comprehensive ROS was negative.  Exam:   BP 106/68   Pulse 70   Temp (!) 97.1 F (36.2 C) (Skin)   Resp 16   Ht 5\' 5"  (1.651 m)   Wt 140 lb (63.5 kg)   LMP 12/18/2018 (Exact Date)   BMI 23.30 kg/m  Height: 5\' 5"  (165.1 cm) Ht Readings from Last 3 Encounters:  01/11/19 5\' 5"  (1.651 m)  07/05/18 5\' 5"  (1.651 m)  06/23/18 5\' 5"  (1.651 m)    General appearance: alert, cooperative and appears stated age Head: Normocephalic, without obvious abnormality,  atraumatic Neck: no adenopathy, supple, symmetrical, trachea midline and thyroid normal to inspection and palpation and no enlargement noted Lungs: clear to auscultation bilaterally Breasts: normal appearance, no masses or tenderness, No nipple retraction or dimpling, No nipple discharge or bleeding, No axillary or supraclavicular adenopathy, surgical scarring on left breast only Heart: regular rate and rhythm Abdomen: soft, non-tender; no masses,  no organomegaly Extremities: extremities normal, atraumatic, no cyanosis or edema Skin: Skin color, texture, turgor normal. No rashes or lesions Lymph nodes: Cervical, supraclavicular, and axillary nodes normal. No abnormal inguinal nodes palpated Neurologic: Grossly normal   Pelvic: External genitalia:  no lesions              Urethra:  normal appearing urethra with no masses, tenderness or lesions              Bartholin's and Skene's: normal                 Vagina: normal appearing vagina with normal color and discharge, no lesions              Cervix: no cervical motion tenderness, no lesions and nulliparous appearance              Pap taken: Yes.   Bimanual Exam:  Uterus:  enlarged, 10 weeks size, nodular feel on left              Adnexa: normal adnexa and no mass, fullness, tenderness               Rectovaginal: Confirms               Anus:  normal sphincter tone, no lesions  Chaperone present: yes  A:  Well Woman with normal exam  Contraception not sexually active  Perimenopausal with cycle change  Enlarged uterus,no size change, history of fibroids  Left breast cancer under oncology follow up  P:   Reviewed health and wellness pertinent to exam  Discussed keeping menstrual calendar and advise if has missed 3 periods or periods have become heavier than her normal.  Discussed fibroid history and no size change with palpation. Questions addressed.  Continue follow up as indicated.  Pap smear: yes  counseled on breast self exam,  mammography screening, feminine hygiene, menopause, adequate intake of calcium and vitamin D, diet and exercise.  return annually or prn  An After Visit Summary was printed and given to the patient.

## 2019-01-11 NOTE — Patient Instructions (Signed)

## 2019-01-13 LAB — CYTOLOGY - PAP: Diagnosis: NEGATIVE

## 2019-06-22 ENCOUNTER — Telehealth: Payer: Self-pay | Admitting: Certified Nurse Midwife

## 2019-06-22 NOTE — Telephone Encounter (Signed)
Patient sent the following message through [place]. Routing to triage to assist patient with request.  [place], [place]  Phone Number: [phone]  Hello.:) [facility] you are doing well! You had mentioned in my last appt. this summer to let you know when I have gone at least 3 months without a cycle. I have been meaning to message you, but kept forgetting! My last cycle was in September , so I have been without one for around four months. This happened a year ago as well when I did not have a cycle for 3 months and then it resumed. So, fingers crossed that this one lasts. Marland Kitchen):) Anyway, I am feeling fine, but wanted to let you know.    Thanks so much. :)

## 2019-06-22 NOTE — Telephone Encounter (Signed)
Last AEX 01/11/19  Routing to Melvia Heaps, CNM to advise on f/u

## 2019-06-23 NOTE — Telephone Encounter (Signed)
Feel she needs OV and labs to evaluate for menopausal and make sure no concerns

## 2019-06-23 NOTE — Telephone Encounter (Signed)
Left message to call Kaityln Kallstrom, RN at [facility] [phone].   

## 2019-06-27 NOTE — Telephone Encounter (Signed)
Spoke with patient, advised per Melvia Heaps, CNM. OV scheduled for 07/04/19 at 1:30pm with Melvia Heaps, CNM. Patient declined earlier OV. Patient verbalizes understanding and is agreeable.   Encounter closed.

## 2019-07-03 ENCOUNTER — Other Ambulatory Visit: Payer: Self-pay

## 2019-07-04 ENCOUNTER — Ambulatory Visit: Payer: BC Managed Care – PPO | Admitting: Certified Nurse Midwife

## 2019-07-04 ENCOUNTER — Telehealth: Payer: Self-pay | Admitting: Certified Nurse Midwife

## 2019-07-04 NOTE — Telephone Encounter (Signed)
No letter please.

## 2019-07-04 NOTE — Telephone Encounter (Signed)
Patient's sister, Santiago Glad, called to cancel patient's appointment for this afternoon as the patient is having to put her pet down and will not be able to make it. Rescheduled to 07/11/19 at 2:00 PM. Send letter or no?

## 2019-07-11 ENCOUNTER — Encounter: Payer: Self-pay | Admitting: Certified Nurse Midwife

## 2019-07-11 ENCOUNTER — Other Ambulatory Visit: Payer: Self-pay

## 2019-07-11 ENCOUNTER — Ambulatory Visit: Payer: BC Managed Care – PPO | Admitting: Certified Nurse Midwife

## 2019-07-11 VITALS — BP 120/78 | HR 68 | Temp 97.9°F | Resp 16 | Wt 137.0 lb

## 2019-07-11 DIAGNOSIS — N912 Amenorrhea, unspecified: Secondary | ICD-10-CM | POA: Diagnosis not present

## 2019-07-11 DIAGNOSIS — N951 Menopausal and female climacteric states: Secondary | ICD-10-CM

## 2019-07-11 LAB — POCT URINE PREGNANCY: Preg Test, Ur: NEGATIVE

## 2019-07-11 NOTE — Patient Instructions (Signed)

## 2019-07-11 NOTE — Progress Notes (Signed)
  54 y.o.Single g0p0 Caucasian female  presents with no menses since January 24, 2019. Periods were occurring monthly, but shorter duration. Cramping was the same, no medication needed. Duration 4- 5 days. Some hot flashes and night sweats. Occasional breast tenderness, but no bleeding.Mammogram scheduled(history of breast cancer). Urine pregnancy negative. Not sexually active ever. Enjoying retirement from teaching. No other health issues today.  Pertinent items noted in HPI and remainder of comprehensive ROS otherwise negative. BP 120/78   Pulse 68   Temp 97.9 F (36.6 C) (Skin)   Resp 16   Wt 137 lb (62.1 kg)   BMI 22.80 kg/m       Physical Exam Exam conducted with a chaperone present.  Constitutional:      Appearance: Normal appearance.  Neck:     Thyroid: No thyroid mass or thyromegaly.  Cardiovascular:     Rate and Rhythm: Normal rate.  Pulmonary:     Effort: Pulmonary effort is normal.  Abdominal:     Palpations: Abdomen is soft. There is no mass.     Tenderness: There is no abdominal tenderness. There is no guarding.  Genitourinary:    General: Normal vulva.     Exam position: Lithotomy position.     Labia:        Right: No rash, tenderness or lesion.        Left: No rash, tenderness or lesion.      Vagina: Normal. No vaginal discharge or tenderness.     Cervix: Normal.     Uterus: Normal.      Adnexa: Right adnexa normal and left adnexa normal.     Comments: Vagina normal appearance with moisture noted. Lymphadenopathy:     Lower Body: No right inguinal adenopathy. No left inguinal adenopathy.  Neurological:     Mental Status: She is alert.    A.Amenorrhea negative UPT Perimenopausal symptoms Normal pelvic exam  P: Discussed thyroid and pituitary changes that can cause amenorrhea in addition to Menopause. Questions addressed. Lab: FSH,TSH, Prolactin  Patient will call if bleeding occurs.   Rv prn

## 2019-07-12 LAB — TSH: TSH: 1.34 u[IU]/mL (ref 0.450–4.500)

## 2019-07-12 LAB — FOLLICLE STIMULATING HORMONE: FSH: 39.1 m[IU]/mL

## 2019-07-12 LAB — PROLACTIN: Prolactin: 19.7 ng/mL (ref 4.8–23.3)

## 2019-08-14 ENCOUNTER — Encounter: Payer: Self-pay | Admitting: Certified Nurse Midwife

## 2019-10-24 HISTORY — PX: BREAST BIOPSY: SHX20

## 2020-01-12 ENCOUNTER — Ambulatory Visit: Payer: BC Managed Care – PPO | Admitting: Certified Nurse Midwife

## 2020-02-02 NOTE — Progress Notes (Signed)
54 y.o. G0P0000 Single White or Caucasian female here for annual exam.  Doing well.  Has aunt and uncle just diagnosed with Covid.  They are in their 70's and they are doing ok.    Cycles are irregular.  Last cycle was in June.  Flow was about 4 days.  Her prior cycle was September, 2020.  Flow wasn't heavy.  She had gotten her Covid vaccination prior to this.  Pt did have hx of low back pain when she was on her cycles but this has gotten better  She had a breast biopsy in June.  H/o DCIS 2017.  Is followed by Dr. Howard-McNatt yearly.  She decided not to take Tamoxifen after the lumpectomy.  She did have radiation therapy.    Genetic testing was negative for BRCA and breast related cancers as well as lynch syndrome.    Sexually active: No.  The current method of family planning is abstinence.    Exercising: Yes.    walking & aerobics Smoker:  no  Health Maintenance: Pap: 12-15-16 neg, 12-16-17 neg, 01-11-2019 neg History of abnormal Pap:  no MMG:  10-26-2019 birads 4: suspicious, 2 rt breast biopsy-neg f/u 6mths (hx of breast cancer).  She is going to have this again in six months.   Colonoscopy:  2020, Dr. Beavers.  Follow up 5 years.   BMD:   none TDaP:  2019 Pneumonia vaccine(s):  no Shingrix:   no Hep C testing: neg 2017 Screening Labs: Lab work will be done in December   reports that she has never smoked. She has never used smokeless tobacco. She reports that she does not drink alcohol and does not use drugs.  Past Medical History:  Diagnosis Date  . Allergy   . Anemia   . Arm laceration    3rd grade  . Breast cancer ([facility]) 2017   DCIS    Past Surgical History:  Procedure Laterality Date  . BREAST LUMPECTOMY    . DILATATION & CURETTAGE/HYSTEROSCOPY WITH TRUECLEAR N/A 12/11/2013   Procedure: DILATATION & CURETTAGE/HYSTEROSCOPY WITH TRUCLEAR;  Surgeon: Tracy H Lathrop, MD;  Location: [facility];  Service: Gynecology;  Laterality: N/A;  . WISDOM TOOTH EXTRACTION  2008    Current  Outpatient Medications  Medication Sig Dispense Refill  . acetaminophen (TYLENOL) 325 MG tablet Take 650 mg by mouth every 6 (six) hours as needed.    . Cholecalciferol (D 1000) 1000 units capsule Take by mouth.    . naproxen sodium (ALEVE) 220 MG tablet Take 220 mg by mouth.     No current facility-administered medications for this visit.    Family History  Problem Relation Age of Onset  . Hypertension Mother 71  . Cancer Mother 56       uterine cancer & lung cancer  . Hyperlipidemia Mother        Deceased  . COPD Mother   . Hearing loss Mother   . Hypertension Father        50s  . Cancer Father        kidney  . Hyperlipidemia Father        Deceased  . Arthritis Father   . Asthma Father   . Hearing loss Father   . Diabetes Maternal Grandmother   . ADD / ADHD Maternal Grandmother   . Emphysema Maternal Grandfather   . COPD Maternal Grandfather   . Heart disease Maternal Grandfather   . Heart attack Maternal Grandfather   . Heart attack Paternal Grandmother   .   Heart attack Paternal Grandfather   . Colon cancer Neg Hx   . Esophageal cancer Neg Hx   . Rectal cancer Neg Hx   . Stomach cancer Neg Hx     Review of Systems  Constitutional: Negative.   HENT: Negative.   Eyes: Negative.   Respiratory: Negative.   Cardiovascular: Negative.   Gastrointestinal: Negative.   Endocrine: Negative.   Genitourinary: Negative.   Musculoskeletal: Negative.   Skin: Negative.   Allergic/Immunologic: Negative.   Neurological: Negative.   Hematological: Negative.   Psychiatric/Behavioral: Negative.     Exam:   Ht 5' 4.75" (1.645 m)   Wt 135 lb (61.2 kg)   BMI 22.64 kg/m   Height: 5' 4.75" (164.5 cm)  General appearance: alert, cooperative and appears stated age Head: Normocephalic, without obvious abnormality, atraumatic Neck: no adenopathy, supple, symmetrical, trachea midline and thyroid normal to inspection and palpation Lungs: clear to auscultation  bilaterally Breasts:  Left breast scar, nipple flat, radiation tatoos noted, non masses, nippled discharge, LAD, skin changes, right breast with two biopsy scars, no masses, LAD, nipple discharge Heart: regular rate and rhythm Abdomen: soft, non-tender; bowel sounds normal; no masses,  no organomegaly Extremities: extremities normal, atraumatic, no cyanosis or edema Skin: Skin color, texture, turgor normal. No rashes or lesions Lymph nodes: Cervical, supraclavicular, and axillary nodes normal. No abnormal inguinal nodes palpated Neurologic: Grossly normal   Pelvic: External genitalia:  no lesions              Urethra:  normal appearing urethra with no masses, tenderness or lesions              Bartholins and Skenes: normal                 Vagina: normal appearing vagina with normal color and discharge, no lesions              Cervix: no lesions              Pap taken: Yes.   Bimanual Exam:  Uterus:  enlarged, 8  weeks size , globular              Adnexa: normal adnexa and no mass, fullness, tenderness               Rectovaginal: Confirms               Anus:  normal sphincter tone, no lesions  Chaperone,Taylor Mitchell, CMA, was present for exam.  A:  Well Woman with normal exam Perimenopausal H/O uterine fibroids, uterus smaller on exam today H/O DCIS 2017 with next calcifications that were biopsies in June.  Follow up due 6 months Family hx of uterine cancer, has undergone negative genetic testing  P:   Mammogram follow up due in December.  Breast surgeon's office schedules this for her pap smear with HR HPV obtained today Colonoscopy every 5 years is planned D/w pt BMD after menopause Will have lab work later this year with Dr. Cirigliano Return annually or prn   

## 2020-02-05 ENCOUNTER — Ambulatory Visit: Payer: BC Managed Care – PPO | Admitting: Obstetrics & Gynecology

## 2020-02-05 ENCOUNTER — Other Ambulatory Visit: Payer: Self-pay

## 2020-02-05 ENCOUNTER — Other Ambulatory Visit (HOSPITAL_COMMUNITY)
Admission: RE | Admit: 2020-02-05 | Discharge: 2020-02-05 | Disposition: A | Discharge status: Home / Self Care | Payer: BC Managed Care – PPO | Source: Ambulatory Visit | Attending: Obstetrics & Gynecology | Admitting: Obstetrics & Gynecology

## 2020-02-05 ENCOUNTER — Encounter: Payer: Self-pay | Admitting: Obstetrics & Gynecology

## 2020-02-05 VITALS — BP 118/78 | HR 68 | Resp 16 | Ht 64.75 in | Wt 135.0 lb

## 2020-02-05 DIAGNOSIS — Z124 Encounter for screening for malignant neoplasm of cervix: Secondary | ICD-10-CM

## 2020-02-05 DIAGNOSIS — Z01419 Encounter for gynecological examination (general) (routine) without abnormal findings: Secondary | ICD-10-CM | POA: Diagnosis not present

## 2020-02-06 LAB — CYTOLOGY - PAP
Comment: NEGATIVE
Diagnosis: NEGATIVE
High risk HPV: NEGATIVE

## 2020-05-09 ENCOUNTER — Other Ambulatory Visit: Payer: Self-pay

## 2020-05-10 ENCOUNTER — Encounter: Payer: Self-pay | Admitting: Family Medicine

## 2020-05-10 ENCOUNTER — Ambulatory Visit (INDEPENDENT_AMBULATORY_CARE_PROVIDER_SITE_OTHER): Payer: BC Managed Care – PPO | Admitting: Family Medicine

## 2020-05-10 VITALS — BP 136/70 | HR 82 | Temp 98.6°F | Ht 65.5 in | Wt 134.6 lb

## 2020-05-10 DIAGNOSIS — Z13220 Encounter for screening for lipoid disorders: Secondary | ICD-10-CM

## 2020-05-10 DIAGNOSIS — Z0000 Encounter for general adult medical examination without abnormal findings: Secondary | ICD-10-CM | POA: Diagnosis not present

## 2020-05-10 DIAGNOSIS — Z1321 Encounter for screening for nutritional disorder: Secondary | ICD-10-CM | POA: Diagnosis not present

## 2020-05-10 DIAGNOSIS — D509 Iron deficiency anemia, unspecified: Secondary | ICD-10-CM | POA: Diagnosis not present

## 2020-05-10 NOTE — Patient Instructions (Signed)
Health Maintenance, Female Adopting a healthy lifestyle and getting preventive care are important in promoting health and wellness. Ask your health care provider about:  The right schedule for you to have regular tests and exams.  Things you can do on your own to prevent diseases and keep yourself healthy. What should I know about diet, weight, and exercise? Eat a healthy diet   Eat a diet that includes plenty of vegetables, fruits, low-fat dairy products, and lean protein.  Do not eat a lot of foods that are high in solid fats, added sugars, or sodium. Maintain a healthy weight Body mass index (BMI) is used to identify weight problems. It estimates body fat based on height and weight. Your health care provider can help determine your BMI and help you achieve or maintain a healthy weight. Get regular exercise Get regular exercise. This is one of the most important things you can do for your health. Most adults should:  Exercise for at least 150 minutes each week. The exercise should increase your heart rate and make you sweat (moderate-intensity exercise).  Do strengthening exercises at least twice a week. This is in addition to the moderate-intensity exercise.  Spend less time sitting. Even light physical activity can be beneficial. Watch cholesterol and blood lipids Have your blood tested for lipids and cholesterol at 53 years of age, then have this test every 5 years. Have your cholesterol levels checked more often if:  Your lipid or cholesterol levels are high.  You are older than 53 years of age.  You are at high risk for heart disease. What should I know about cancer screening? Depending on your health history and family history, you may need to have cancer screening at various ages. This may include screening for:  Breast cancer.  Cervical cancer.  Colorectal cancer.  Skin cancer.  Lung cancer. What should I know about heart disease, diabetes, and high blood  pressure? Blood pressure and heart disease  High blood pressure causes heart disease and increases the risk of stroke. This is more likely to develop in people who have high blood pressure readings, are of African descent, or are overweight.  Have your blood pressure checked: ? Every 3-5 years if you are 18-39 years of age. ? Every year if you are 40 years old or older. Diabetes Have regular diabetes screenings. This checks your fasting blood sugar level. Have the screening done:  Once every three years after age 40 if you are at a normal weight and have a low risk for diabetes.  More often and at a younger age if you are overweight or have a high risk for diabetes. What should I know about preventing infection? Hepatitis B If you have a higher risk for hepatitis B, you should be screened for this virus. Talk with your health care provider to find out if you are at risk for hepatitis B infection. Hepatitis C Testing is recommended for:  Everyone born from 1945 through 1965.  Anyone with known risk factors for hepatitis C. Sexually transmitted infections (STIs)  Get screened for STIs, including gonorrhea and chlamydia, if: ? You are sexually active and are younger than 53 years of age. ? You are older than 53 years of age and your health care provider tells you that you are at risk for this type of infection. ? Your sexual activity has changed since you were last screened, and you are at increased risk for chlamydia or gonorrhea. Ask your health care provider if   you are at risk.  Ask your health care provider about whether you are at high risk for HIV. Your health care provider may recommend a prescription medicine to help prevent HIV infection. If you choose to take medicine to prevent HIV, you should first get tested for HIV. You should then be tested every 3 months for as long as you are taking the medicine. Pregnancy  If you are about to stop having your period (premenopausal) and  you may become pregnant, seek counseling before you get pregnant.  Take 400 to 800 micrograms (mcg) of folic acid every day if you become pregnant.  Ask for birth control (contraception) if you want to prevent pregnancy. Osteoporosis and menopause Osteoporosis is a disease in which the bones lose minerals and strength with aging. This can result in bone fractures. If you are 65 years old or older, or if you are at risk for osteoporosis and fractures, ask your health care provider if you should:  Be screened for bone loss.  Take a calcium or vitamin D supplement to lower your risk of fractures.  Be given hormone replacement therapy (HRT) to treat symptoms of menopause. Follow these instructions at home: Lifestyle  Do not use any products that contain nicotine or tobacco, such as cigarettes, e-cigarettes, and chewing tobacco. If you need help quitting, ask your health care provider.  Do not use street drugs.  Do not share needles.  Ask your health care provider for help if you need support or information about quitting drugs. Alcohol use  Do not drink alcohol if: ? Your health care provider tells you not to drink. ? You are pregnant, may be pregnant, or are planning to become pregnant.  If you drink alcohol: ? Limit how much you use to 0-1 drink a day. ? Limit intake if you are breastfeeding.  Be aware of how much alcohol is in your drink. In the [place] one drink equals one 12 oz bottle of beer (355 mL), one 5 oz glass of wine (148 mL), or one 1 oz glass of hard liquor (44 mL). General instructions  Schedule regular health, dental, and eye exams.  Stay current with your vaccines.  Tell your health care provider if: ? You often feel depressed. ? You have ever been abused or do not feel safe at home. Summary  Adopting a healthy lifestyle and getting preventive care are important in promoting health and wellness.  Follow your health care provider's instructions about healthy  diet, exercising, and getting tested or screened for diseases.  Follow your health care provider's instructions on monitoring your cholesterol and blood pressure. This information is not intended to replace advice given to you by your health care provider. Make sure you discuss any questions you have with your health care provider. Document Revised: 05/04/2018 Document Reviewed: 05/04/2018 Elsevier Patient Education  2020 Elsevier Inc.  

## 2020-05-10 NOTE — Progress Notes (Signed)
Betty Huber is a 54 y.o. female  Chief Complaint  Patient presents with  . Establish Care    NP- CPE/labs.  Not fasting today.  No concerns.     HPI: Betty Huber is a 54 y.o. female seen today for annual CPE. She is not fasting. She has no acute issues or concerns.   Last PAP: UTD - follows with GYN Dr. Hale Bogus Last mammo: UTD but due in 04/2020 for Rt diagnostic mammo and has appt scheduled Last colonoscopy: 06/2018 - Dr. Tarri Glenn - due in 06/2023 (fam h/o polyps)  Dental: UTD - goes every 6 mo Vision: due   Derm - sees annually - Dr. Delman Cheadle  Past Medical History:  Diagnosis Date  . Allergy   . Anemia   . Arm laceration    3rd grade  . Breast cancer ([facility]) 2017   DCIS    Past Surgical History:  Procedure Laterality Date  . BREAST BIOPSY  10/2019  . BREAST LUMPECTOMY    . DILATATION & CURETTAGE/HYSTEROSCOPY WITH TRUECLEAR N/A 12/11/2013   Procedure: DILATATION & CURETTAGE/HYSTEROSCOPY WITH TRUCLEAR;  Surgeon: Azalia Bilis, MD;  Location: [facility];  Service: Gynecology;  Laterality: N/A;  . WISDOM TOOTH EXTRACTION  2008    Social History   Socioeconomic History  . Marital status: Single    Spouse name: Not on file  . Number of children: Not on file  . Years of education: Not on file  . Highest education level: Not on file  Occupational History  . Not on file  Tobacco Use  . Smoking status: Never Smoker  . Smokeless tobacco: Never Used  Vaping Use  . Vaping Use: Never used  Substance and Sexual Activity  . Alcohol use: No    Alcohol/week: 0.0 standard drinks  . Drug use: No  . Sexual activity: Not Currently    Partners: Male    Birth control/protection: Abstinence  Other Topics Concern  . 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   She lives alone, no children.   Highest level of education:  B.S.   Social Determinants of Health   Financial Resource Strain: Not on file  Food Insecurity:  Not on file  Transportation Needs: Not on file  Physical Activity: Not on file  Stress: Not on file  Social Connections: Not on file  Intimate Partner Violence: Not on file    Family History  Problem Relation Age of Onset  . Hypertension Mother 69  . Cancer Mother 57       uterine cancer & lung cancer  . Hyperlipidemia Mother        Deceased  . COPD Mother   . Hearing loss Mother   . Hypertension Father        82s  . Cancer Father        kidney  . Hyperlipidemia Father        Deceased  . Arthritis Father   . Asthma Father   . Hearing loss Father   . Diabetes Maternal Grandmother   . ADD / ADHD Maternal Grandmother   . Emphysema Maternal Grandfather   . COPD Maternal Grandfather   . Heart disease Maternal Grandfather   . Heart attack Maternal Grandfather   . Heart attack Paternal Grandmother   . Heart attack Paternal Grandfather   . Colon cancer Neg Hx   . Esophageal cancer Neg Hx   . Rectal cancer Neg  Hx   . Stomach cancer Neg Hx      Immunization History  Administered Date(s) Administered  . Influenza-Unspecified 04/22/2020  . PFIZER SARS-COV-2 Vaccination 08/24/2019, 09/14/2019, 04/22/2020  . Tdap 11/21/2007, 12/16/2017    Outpatient Encounter Medications as of 05/10/2020  Medication Sig Note  . acetaminophen (TYLENOL) 325 MG tablet Take 650 mg by mouth every 6 (six) hours as needed.   . Cholecalciferol 25 MCG (1000 UT) capsule Take by mouth. 12/12/2015: Received from: [facility]  . naproxen sodium (ALEVE) 220 MG tablet Take 220 mg by mouth.    No facility-administered encounter medications on file as of 05/10/2020.     ROS: Gen: no fever, chills  Skin: no rash, itching ENT: no ear pain, ear drainage, nasal congestion, rhinorrhea, sinus pressure, sore throat Eyes: no blurry vision, double vision Resp: no cough, wheeze,SOB CV: no CP, palpitations, LE edema,  GI: no heartburn, n/v/d/c, abd pain GU: no dysuria, urgency, frequency,  hematuria MSK: no joint pain, myalgias, back pain Neuro: no dizziness, headache, weakness, vertigo Psych: no depression, anxiety, insomnia   No Known Allergies  BP 136/70   Pulse 82   Temp 98.6 F (37 C) (Temporal)   Ht 5' 5.5" (1.664 m)   Wt 134 lb 9.6 oz (61.1 kg)   SpO2 99%   BMI 22.06 kg/m   BP Readings from Last 3 Encounters:  05/10/20 136/70  02/05/20 118/78  07/11/19 120/78    Physical Exam Constitutional:      General: She is not in acute distress.    Appearance: She is well-developed and well-nourished.  HENT:     Head: Normocephalic and atraumatic.     Right Ear: Tympanic membrane and ear canal normal.     Left Ear: Tympanic membrane and ear canal normal.     Nose: Nose normal.     Mouth/Throat:     Mouth: Oropharynx is clear and moist and mucous membranes are normal.  Eyes:     Conjunctiva/sclera: Conjunctivae normal.     Pupils: Pupils are equal, round, and reactive to light.  Neck:     Thyroid: No thyromegaly.  Cardiovascular:     Rate and Rhythm: Normal rate and regular rhythm.     Pulses: Intact distal pulses.     Heart sounds: Normal heart sounds. No murmur heard.   Pulmonary:     Effort: Pulmonary effort is normal. No respiratory distress.     Breath sounds: Normal breath sounds. No wheezing or rhonchi.  Abdominal:     General: Bowel sounds are normal. There is no distension.     Palpations: Abdomen is soft. There is no mass.     Tenderness: There is no abdominal tenderness.  Musculoskeletal:        General: No edema.     Cervical back: Neck supple.  Lymphadenopathy:     Cervical: No cervical adenopathy.  Skin:    General: Skin is warm and dry.  Neurological:     Mental Status: She is alert and oriented to person, place, and time.     Motor: No abnormal muscle tone.     Coordination: Coordination normal.  Psychiatric:        Mood and Affect: Mood and affect normal.        Behavior: Behavior normal.      A/P:  1. Annual physical  exam - discussed importance of regular CV exercise, healthy diet, adequate sleep - colonoscopy and PAP UTD, 30mo f/u diagnostic mammo  scheduled - UTD on dental exam, pt to schedule vision exam - immunizations UTD - ALT; Future - AST; Future - Basic metabolic panel; Future - CBC; Future - Lipid panel; Future - next CPE in 1 year  2. Iron deficiency anemia, unspecified iron deficiency anemia type - CBC; Future - Iron, TIBC and Ferritin Panel; Future  3. Screening for lipid disorders - Lipid panel; Future  4. Encounter for vitamin deficiency screening - takes 1000IU daily - VITAMIN D 25 Hydroxy (Vit-D Deficiency, Fractures); Future   This visit occurred during the SARS-CoV-2 public health emergency.  Safety protocols were in place, including screening questions prior to the visit, additional usage of staff PPE, and extensive cleaning of exam room while observing appropriate contact time as indicated for disinfecting solutions.

## 2020-05-13 ENCOUNTER — Other Ambulatory Visit (INDEPENDENT_AMBULATORY_CARE_PROVIDER_SITE_OTHER): Payer: BC Managed Care – PPO

## 2020-05-13 ENCOUNTER — Other Ambulatory Visit: Payer: Self-pay

## 2020-05-13 DIAGNOSIS — Z1321 Encounter for screening for nutritional disorder: Secondary | ICD-10-CM

## 2020-05-13 DIAGNOSIS — Z13220 Encounter for screening for lipoid disorders: Secondary | ICD-10-CM | POA: Diagnosis not present

## 2020-05-13 DIAGNOSIS — Z0000 Encounter for general adult medical examination without abnormal findings: Secondary | ICD-10-CM

## 2020-05-13 DIAGNOSIS — D509 Iron deficiency anemia, unspecified: Secondary | ICD-10-CM

## 2020-05-13 LAB — BASIC METABOLIC PANEL
BUN: 9 mg/dL (ref 6–23)
CO2: 31 mEq/L (ref 19–32)
Calcium: 9.1 mg/dL (ref 8.4–10.5)
Chloride: 101 mEq/L (ref 96–112)
Creatinine, Ser: 0.69 mg/dL (ref 0.40–1.20)
GFR: 99.02 mL/min (ref >60.00)
Glucose, Bld: 78 mg/dL (ref 70–99)
Potassium: 3.7 mEq/L (ref 3.5–5.1)
Sodium: 138 mEq/L (ref 135–145)

## 2020-05-13 LAB — AST: AST: 15 U/L (ref 0–37)

## 2020-05-13 LAB — CBC
HCT: 41 % (ref 36.0–46.0)
Hemoglobin: 14.2 g/dL (ref 12.0–15.0)
MCHC: 34.7 g/dL (ref 30.0–36.0)
MCV: 91.7 fl (ref 78.0–100.0)
Platelets: 331 10*3/uL (ref 150.0–400.0)
RBC: 4.47 Mil/uL (ref 3.87–5.11)
RDW: 12.5 % (ref 11.5–15.5)
WBC: 5.1 10*3/uL (ref 4.0–10.5)

## 2020-05-13 LAB — LIPID PANEL
Cholesterol: 178 mg/dL (ref 0–200)
HDL: 60.6 mg/dL (ref >39.00)
LDL Cholesterol: 107 mg/dL — ABNORMAL HIGH (ref 0–99)
NonHDL: 117.11
Total CHOL/HDL Ratio: 3
Triglycerides: 52 mg/dL (ref 0.0–149.0)
VLDL: 10.4 mg/dL (ref 0.0–40.0)

## 2020-05-13 LAB — ALT: ALT: 11 U/L (ref 0–35)

## 2020-05-13 LAB — VITAMIN D 25 HYDROXY (VIT D DEFICIENCY, FRACTURES): VITD: 50.67 ng/mL (ref 30.00–100.00)

## 2020-05-15 ENCOUNTER — Encounter: Payer: Self-pay | Admitting: Family Medicine

## 2020-05-15 LAB — IRON,TIBC AND FERRITIN PANEL
%SAT: 37 % (calc) (ref 16–45)
Ferritin: 34 ng/mL (ref 16–232)
Iron: 113 ug/dL (ref 45–160)
TIBC: 302 mcg/dL (calc) (ref 250–450)

## 2020-05-23 ENCOUNTER — Other Ambulatory Visit: Payer: BC Managed Care – PPO

## 2021-02-17 ENCOUNTER — Other Ambulatory Visit: Payer: Self-pay

## 2021-02-17 ENCOUNTER — Ambulatory Visit (INDEPENDENT_AMBULATORY_CARE_PROVIDER_SITE_OTHER): Payer: BC Managed Care – PPO | Admitting: Obstetrics & Gynecology

## 2021-02-17 ENCOUNTER — Encounter (HOSPITAL_BASED_OUTPATIENT_CLINIC_OR_DEPARTMENT_OTHER): Payer: Self-pay | Admitting: Obstetrics & Gynecology

## 2021-02-17 ENCOUNTER — Other Ambulatory Visit (HOSPITAL_COMMUNITY)
Admission: RE | Admit: 2021-02-17 | Discharge: 2021-02-17 | Disposition: A | Discharge status: Home / Self Care | Payer: BC Managed Care – PPO | Source: Ambulatory Visit | Attending: Obstetrics & Gynecology | Admitting: Obstetrics & Gynecology

## 2021-02-17 VITALS — BP 136/78 | HR 95 | Ht 65.0 in | Wt 141.0 lb

## 2021-02-17 DIAGNOSIS — Z124 Encounter for screening for malignant neoplasm of cervix: Secondary | ICD-10-CM | POA: Diagnosis not present

## 2021-02-17 DIAGNOSIS — D251 Intramural leiomyoma of uterus: Secondary | ICD-10-CM

## 2021-02-17 DIAGNOSIS — D0512 Intraductal carcinoma in situ of left breast: Secondary | ICD-10-CM | POA: Diagnosis not present

## 2021-02-17 DIAGNOSIS — Z0000 Encounter for general adult medical examination without abnormal findings: Secondary | ICD-10-CM

## 2021-02-17 DIAGNOSIS — Z01419 Encounter for gynecological examination (general) (routine) without abnormal findings: Secondary | ICD-10-CM

## 2021-02-17 NOTE — Progress Notes (Signed)
54 y.o. G0P0000 Single White or Caucasian female here for annual exam. Denies vaginal bleeding this past year.  Has been more than a year now.    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  Declined Tamoxifen therapy.    No LMP recorded. Patient is perimenopausal.          Sexually active: No.  The current method of family planning is post menopausal status.    Exercising: Yes.     Walking every day Smoker:  no  Health Maintenance: Pap:  02/05/2020 Negative History of abnormal Pap:  no MMG:  11/21/2020, at [place] Colonoscopy:  07/05/2018 BMD:   plan closer to age 38 TDaP:  2019 Shingrix:   discussed today Hep C testing: 2017 Screening Labs: 04/2020   reports that she has never smoked. She has never used smokeless tobacco. She reports that she does not drink alcohol and does not use drugs.  Past Medical History:  Diagnosis Date   Allergy    Anemia    Arm laceration    3rd grade   Breast cancer ([place]) 2017   DCIS    Past Surgical History:  Procedure Laterality Date   BREAST BIOPSY  10/2019   BREAST LUMPECTOMY     DILATATION & CURETTAGE/HYSTEROSCOPY WITH TRUECLEAR N/A 12/11/2013   Procedure: DILATATION & CURETTAGE/HYSTEROSCOPY WITH TRUCLEAR;  Surgeon: Azalia Bilis, MD;  Location: [facility];  Service: Gynecology;  Laterality: N/A;   WISDOM TOOTH EXTRACTION  2008    Current Outpatient Medications  Medication Sig Dispense Refill   Cholecalciferol 25 MCG (1000 UT) capsule Take by mouth.     naproxen sodium (ALEVE) 220 MG tablet Take 220 mg by mouth.     acetaminophen (TYLENOL) 325 MG tablet Take 650 mg by mouth every 6 (six) hours as needed.     No current facility-administered medications for this visit.    Family History  Problem Relation Age of Onset   Hypertension Mother 36   Cancer Mother 53       uterine cancer & lung cancer   Hyperlipidemia Mother        Deceased   COPD Mother    Hearing loss Mother    Hypertension Father        60s   Cancer Father         kidney   Hyperlipidemia Father        Deceased   Arthritis Father    Asthma Father    Hearing loss Father    Diabetes Maternal Grandmother    ADD / ADHD Maternal Grandmother    Emphysema Maternal Grandfather    COPD Maternal Grandfather    Heart disease Maternal Grandfather    Heart attack Maternal Grandfather    Heart attack Paternal Grandmother    Heart attack Paternal Grandfather    Colon cancer Neg Hx    Esophageal cancer Neg Hx    Rectal cancer Neg Hx    Stomach cancer Neg Hx     Review of Systems  All other systems reviewed and are negative.  Exam:   BP 136/78 (BP Location: Right Arm, Patient Position: Sitting, Cuff Size: Small)   Pulse 95   Ht 5\' 5"  (1.651 m) Comment: reported  Wt 141 lb (64 kg)   BMI 23.46 kg/m   Height: 5\' 5"  (165.1 cm) (reported)  General appearance: alert, cooperative and appears stated age Head: Normocephalic, without obvious abnormality, atraumatic Neck: no adenopathy, supple, symmetrical, trachea midline and thyroid normal to  inspection and palpation Lungs: clear to auscultation bilaterally Breasts:right breast without masses, skin changes, LAD; right breast with well healed scar and radiation changes, no masses, left nipple is flat, no LAD Heart: regular rate and rhythm Abdomen: soft, non-tender; bowel sounds normal; no masses,  no organomegaly Extremities: extremities normal, atraumatic, no cyanosis or edema Skin: Skin color, texture, turgor normal. No rashes or lesions Lymph nodes: Cervical, supraclavicular, and axillary nodes normal. No abnormal inguinal nodes palpated Neurologic: Grossly normal   Pelvic: External genitalia:  no lesions              Urethra:  normal appearing urethra with no masses, tenderness or lesions              Bartholins and Skenes: normal                 Vagina: normal appearing vagina with normal color and no discharge, no lesions              Cervix: no lesions              Pap taken: Yes.   Bimanual  Exam:  Uterus: 8-10 weeks size, nodular, stable              Adnexa: normal adnexa and no mass, fullness, tenderness               Rectovaginal: Confirms               Anus:  normal sphincter tone, no lesions  Chaperone, Octaviano Batty, CMA, was present for exam.  Assessment/Plan: 1.  Well woman exam (no gynecological exam) - pap obtained today.  Pt desires yearly.  HR HPV negative from 2021 so not ordered today - MMG done 10/2020 and in Care Everywhere - Colonoscopy 11/03/2018 - discussed timing of starting BMD - Care Gaps reviewed/updated - lab work done 04/2020  2. Intramural leiomyoma of uterus - stable exam, last ultrasound was 2018  3. 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  Elevated BP without hypertension diagnosis - pt monitors at home and BPs are normal

## 2021-02-18 LAB — CYTOLOGY - PAP: Diagnosis: NEGATIVE

## 2021-02-25 ENCOUNTER — Encounter (HOSPITAL_BASED_OUTPATIENT_CLINIC_OR_DEPARTMENT_OTHER): Payer: Self-pay

## 2022-01-28 ENCOUNTER — Encounter: Payer: Self-pay | Admitting: Nurse Practitioner

## 2022-01-28 ENCOUNTER — Ambulatory Visit: Payer: BC Managed Care – PPO | Admitting: Nurse Practitioner

## 2022-01-28 VITALS — BP 140/82 | HR 114 | Temp 97.7°F | Ht 65.0 in | Wt 139.6 lb

## 2022-01-28 DIAGNOSIS — R030 Elevated blood-pressure reading, without diagnosis of hypertension: Secondary | ICD-10-CM | POA: Diagnosis not present

## 2022-01-28 NOTE — Progress Notes (Signed)
                Established Patient Visit  Patient: Betty Huber   DOB: [birth year]   56 y.o. Female  MRN: [id number] Visit Date: 01/28/2022  Subjective:    Chief Complaint  Patient presents with   Establish Care    TOC  Has some BP concerns  No other concerns  PHQ completed    HPI Elevated BP without diagnosis of hypertension First noted 86month ago during oncology appt. Reports Home BP 120s/70s Denies any HA or dizziness or CP or SOB. Exercise: walking daily No ETOH use, no tobacco use, no OSA/snoring BP Readings from Last 3 Encounters:  01/28/22 (!) 140/82  02/17/21 136/78  05/10/20 136/70   Advised to monitor BP at home 3x/week. Call office if BP >140/80 Maintain DASH diet F/up in 129monthReviewed medical, surgical, and social history today  Medications: Outpatient Medications Prior to Visit  Medication Sig   acetaminophen (TYLENOL) 325 MG tablet Take 650 mg by mouth every 6 (six) hours as needed.   Cholecalciferol 25 MCG (1000 UT) capsule Take by mouth.   naproxen sodium (ALEVE) 220 MG tablet Take 220 mg by mouth.   No facility-administered medications prior to visit.   Reviewed past medical and social history.   ROS per HPI above      Objective:  BP (!) 140/82 (BP Location: Right Arm, Patient Position: Sitting, Cuff Size: Small)   Pulse (!) 114   Temp 97.7 F (36.5 C) (Temporal)   Ht '5\' 5"'$  (1.651 m)   Wt 139 lb 9.6 oz (63.3 kg)   SpO2 99%   BMI 23.23 kg/m      Physical Exam  No results found for any visits on 01/28/22.    Assessment & Plan:    Problem List Items Addressed This Visit       Other   Elevated BP without diagnosis of hypertension - Primary    First noted 48m44monthgo during oncology appt. Reports Home BP 120s/70s Denies any HA or dizziness or CP or SOB. Exercise: walking daily No ETOH use, no tobacco use, no OSA/snoring BP Readings from Last 3 Encounters:  01/28/22 (!) 140/82  02/17/21 136/78  05/10/20 136/70   Advised to  monitor BP at home 3x/week. Call office if BP >140/80 Maintain DASH diet F/up in 43mo37month  Return in about 4 weeks (around 02/25/2022) for CPE (fasting).     CharWilfred Lacy

## 2022-01-28 NOTE — Assessment & Plan Note (Addendum)
First noted 64month ago during oncology appt. Reports Home BP 120s/70s Denies any HA or dizziness or CP or SOB. Exercise: walking daily No ETOH use, no tobacco use, no OSA/snoring BP Readings from Last 3 Encounters:  01/28/22 (!) 140/82  02/17/21 136/78  05/10/20 136/70   Advised to monitor BP at home 3x/week. Call office if BP >140/80 Maintain DASH diet F/up in 163month

## 2022-01-28 NOTE — Patient Instructions (Signed)
Monitor BP 3x/week in AM Send BP reading via mychart on Monday Call office if BP >140/80 Maintain DASH diet  Hypertension, Adult High blood pressure (hypertension) is when the force of blood pumping through the arteries is too strong. The arteries are the blood vessels that carry blood from the heart throughout the body. Hypertension forces the heart to work harder to pump blood and may cause arteries to become narrow or stiff. Untreated or uncontrolled hypertension can lead to a heart attack, heart failure, a stroke, kidney disease, and other problems. A blood pressure reading consists of a higher number over a lower number. Ideally, your blood pressure should be below 120/80. The first ("top") number is called the systolic pressure. It is a measure of the pressure in your arteries as your heart beats. The second ("bottom") number is called the diastolic pressure. It is a measure of the pressure in your arteries as the heart relaxes. What are the causes? The exact cause of this condition is not known. There are some conditions that result in high blood pressure. What increases the risk? Certain factors may make you more likely to develop high blood pressure. Some of these risk factors are under your control, including: Smoking. Not getting enough exercise or physical activity. Being overweight. Having too much fat, sugar, calories, or salt (sodium) in your diet. Drinking too much alcohol. Other risk factors include: Having a personal history of heart disease, diabetes, high cholesterol, or kidney disease. Stress. Having a family history of high blood pressure and high cholesterol. Having obstructive sleep apnea. Age. The risk increases with age. What are the signs or symptoms? High blood pressure may not cause symptoms. Very high blood pressure (hypertensive crisis) may cause: Headache. Fast or irregular heartbeats (palpitations). Shortness of breath. Nosebleed. Nausea and  vomiting. Vision changes. Severe chest pain, dizziness, and seizures. How is this diagnosed? This condition is diagnosed by measuring your blood pressure while you are seated, with your arm resting on a flat surface, your legs uncrossed, and your feet flat on the floor. The cuff of the blood pressure monitor will be placed directly against the skin of your upper arm at the level of your heart. Blood pressure should be measured at least twice using the same arm. Certain conditions can cause a difference in blood pressure between your right and left arms. If you have a high blood pressure reading during one visit or you have normal blood pressure with other risk factors, you may be asked to: Return on a different day to have your blood pressure checked again. Monitor your blood pressure at home for 1 week or longer. If you are diagnosed with hypertension, you may have other blood or imaging tests to help your health care provider understand your overall risk for other conditions. How is this treated? This condition is treated by making healthy lifestyle changes, such as eating healthy foods, exercising more, and reducing your alcohol intake. You may be referred for counseling on a healthy diet and physical activity. Your health care provider may prescribe medicine if lifestyle changes are not enough to get your blood pressure under control and if: Your systolic blood pressure is above 130. Your diastolic blood pressure is above 80. Your personal target blood pressure may vary depending on your medical conditions, your age, and other factors. Follow these instructions at home: Eating and drinking  Eat a diet that is high in fiber and potassium, and low in sodium, added sugar, and fat. An example of  this eating plan is called the DASH diet. DASH stands for Dietary Approaches to Stop Hypertension. To eat this way: Eat plenty of fresh fruits and vegetables. Try to fill one half of your plate at each  meal with fruits and vegetables. Eat whole grains, such as whole-wheat pasta, brown rice, or whole-grain bread. Fill about one fourth of your plate with whole grains. Eat or drink low-fat dairy products, such as skim milk or low-fat yogurt. Avoid fatty cuts of meat, processed or cured meats, and poultry with skin. Fill about one fourth of your plate with lean proteins, such as fish, chicken without skin, beans, eggs, or tofu. Avoid pre-made and processed foods. These tend to be higher in sodium, added sugar, and fat. Reduce your daily sodium intake. Many people with hypertension should eat less than 1,500 mg of sodium a day. Do not drink alcohol if: Your health care provider tells you not to drink. You are pregnant, may be pregnant, or are planning to become pregnant. If you drink alcohol: Limit how much you have to: 0-1 drink a day for women. 0-2 drinks a day for men. Know how much alcohol is in your drink. In the [place] one drink equals one 12 oz bottle of beer (355 mL), one 5 oz glass of wine (148 mL), or one 1 oz glass of hard liquor (44 mL). Lifestyle  Work with your health care provider to maintain a healthy body weight or to lose weight. Ask what an ideal weight is for you. Get at least 30 minutes of exercise that causes your heart to beat faster (aerobic exercise) most days of the week. Activities may include walking, swimming, or biking. Include exercise to strengthen your muscles (resistance exercise), such as Pilates or lifting weights, as part of your weekly exercise routine. Try to do these types of exercises for 30 minutes at least 3 days a week. Do not use any products that contain nicotine or tobacco. These products include cigarettes, chewing tobacco, and vaping devices, such as e-cigarettes. If you need help quitting, ask your health care provider. Monitor your blood pressure at home as told by your health care provider. Keep all follow-up visits. This is  important. Medicines Take over-the-counter and prescription medicines only as told by your health care provider. Follow directions carefully. Blood pressure medicines must be taken as prescribed. Do not skip doses of blood pressure medicine. Doing this puts you at risk for problems and can make the medicine less effective. Ask your health care provider about side effects or reactions to medicines that you should watch for. Contact a health care provider if you: Think you are having a reaction to a medicine you are taking. Have headaches that keep coming back (recurring). Feel dizzy. Have swelling in your ankles. Have trouble with your vision. Get help right away if you: Develop a severe headache or confusion. Have unusual weakness or numbness. Feel faint. Have severe pain in your chest or abdomen. Vomit repeatedly. Have trouble breathing. These symptoms may be an emergency. Get help right away. Call 911. Do not wait to see if the symptoms will go away. Do not drive yourself to the hospital. Summary Hypertension is when the force of blood pumping through your arteries is too strong. If this condition is not controlled, it may put you at risk for serious complications. Your personal target blood pressure may vary depending on your medical conditions, your age, and other factors. For most people, a normal blood pressure is less than  120/80. Hypertension is treated with lifestyle changes, medicines, or a combination of both. Lifestyle changes include losing weight, eating a healthy, low-sodium diet, exercising more, and limiting alcohol. This information is not intended to replace advice given to you by your health care provider. Make sure you discuss any questions you have with your health care provider. Document Revised: 03/18/2021 Document Reviewed: 03/18/2021 Elsevier Patient Education  [place].

## 2022-02-02 ENCOUNTER — Encounter: Payer: Self-pay | Admitting: Nurse Practitioner

## 2022-02-23 ENCOUNTER — Ambulatory Visit (INDEPENDENT_AMBULATORY_CARE_PROVIDER_SITE_OTHER): Payer: BC Managed Care – PPO | Admitting: Obstetrics & Gynecology

## 2022-02-23 ENCOUNTER — Encounter (HOSPITAL_BASED_OUTPATIENT_CLINIC_OR_DEPARTMENT_OTHER): Payer: Self-pay | Admitting: Obstetrics & Gynecology

## 2022-02-23 ENCOUNTER — Other Ambulatory Visit (HOSPITAL_COMMUNITY)
Admission: RE | Admit: 2022-02-23 | Discharge: 2022-02-23 | Disposition: A | Discharge status: Home / Self Care | Payer: BC Managed Care – PPO | Source: Ambulatory Visit | Attending: Obstetrics & Gynecology | Admitting: Obstetrics & Gynecology

## 2022-02-23 VITALS — BP 129/79 | HR 100 | Ht 65.0 in | Wt 140.2 lb

## 2022-02-23 DIAGNOSIS — D0512 Intraductal carcinoma in situ of left breast: Secondary | ICD-10-CM | POA: Diagnosis not present

## 2022-02-23 DIAGNOSIS — Z01419 Encounter for gynecological examination (general) (routine) without abnormal findings: Secondary | ICD-10-CM

## 2022-02-23 DIAGNOSIS — Z124 Encounter for screening for malignant neoplasm of cervix: Secondary | ICD-10-CM | POA: Insufficient documentation

## 2022-02-23 DIAGNOSIS — Z8049 Family history of malignant neoplasm of other genital organs: Secondary | ICD-10-CM | POA: Diagnosis not present

## 2022-02-23 DIAGNOSIS — D251 Intramural leiomyoma of uterus: Secondary | ICD-10-CM

## 2022-02-23 NOTE — Progress Notes (Signed)
56 y.o. G0P0000 Single White or Caucasian female here for annual exam.  Doing well.  Seeing Dr. Elisha Headland at [facility] in [facility].  She did have radiation but not Tamoxifen.  Genetic testing was negative.   Desires yearly pap smear screening.  Last HR HPV was 2021.  Pt and discussed.  Will not repeat this today.  No LMP recorded. Patient is perimenopausal.          Sexually active: No.  The current method of family planning is abstinence.    Exercising: Yes.    walking Smoker:  no  Health Maintenance: Pap:  02/17/2021 Negative History of abnormal Pap:  no MMG:  12/02/2021 Colonoscopy:  07/05/2018 BMD:   will plan around age 65 Screening Labs: planned for November   reports that she has never smoked. She has never used smokeless tobacco. She reports that she does not drink alcohol and does not use drugs.  Past Medical History:  Diagnosis Date   Allergy    Anemia    Arm laceration    3rd grade   Breast cancer ([place]) 2017   DCIS    Past Surgical History:  Procedure Laterality Date   BREAST BIOPSY  10/2019   BREAST LUMPECTOMY     DILATATION & CURETTAGE/HYSTEROSCOPY WITH TRUECLEAR N/A 12/11/2013   Procedure: DILATATION & CURETTAGE/HYSTEROSCOPY WITH TRUCLEAR;  Surgeon: Azalia Bilis, MD;  Location: [facility];  Service: Gynecology;  Laterality: N/A;   WISDOM TOOTH EXTRACTION  2008    Current Outpatient Medications  Medication Sig Dispense Refill   Cholecalciferol 25 MCG (1000 UT) capsule Take by mouth.     No current facility-administered medications for this visit.    Family History  Problem Relation Age of Onset   Hypertension Mother 59   Cancer Mother 63       uterine cancer & lung cancer   Hyperlipidemia Mother        Deceased   COPD Mother    Hearing loss Mother    Hypertension Father        80s   Cancer Father        kidney   Hyperlipidemia Father        Deceased   Arthritis Father    Asthma Father    Hearing loss Father    Diabetes Maternal  Grandmother    ADD / ADHD Maternal Grandmother    Emphysema Maternal Grandfather    COPD Maternal Grandfather    Heart disease Maternal Grandfather    Heart attack Maternal Grandfather    Heart attack Paternal Grandmother    Heart attack Paternal Grandfather    Colon cancer Neg Hx    Esophageal cancer Neg Hx    Rectal cancer Neg Hx    Stomach cancer Neg Hx     ROS: Constitutional: negative Genitourinary:negative  Exam:   BP 129/79 (BP Location: Right Arm, Patient Position: Sitting, Cuff Size: Normal)   Pulse 100   Ht '5\' 5"'$  (1.651 m) Comment: Reported  Wt 140 lb 3.2 oz (63.6 kg)   BMI 23.33 kg/m   Height: '5\' 5"'$  (165.1 cm) (Reported)  General appearance: alert, cooperative and appears stated age Head: Normocephalic, without obvious abnormality, atraumatic Neck: no adenopathy, supple, symmetrical, trachea midline and thyroid normal to inspection and palpation Lungs: clear to auscultation bilaterally Breasts: normal appearance, no masses or tenderness except for left breast scar and radiation changes that are stable Heart: regular rate and rhythm Abdomen: soft, non-tender; bowel sounds normal; no masses,  no organomegaly Extremities: extremities normal, atraumatic, no cyanosis or edema Skin: Skin color, texture, turgor normal. No rashes or lesions Lymph nodes: Cervical, supraclavicular, and axillary nodes normal. No abnormal inguinal nodes palpated Neurologic: Grossly normal   Pelvic: External genitalia:  no lesions              Urethra:  normal appearing urethra with no masses, tenderness or lesions              Bartholins and Skenes: normal                 Vagina: normal appearing vagina with normal color and no discharge, no lesions              Cervix: no lesions              Pap taken: Yes.   Bimanual Exam:  Uterus:  normal size, contour, position, consistency, mobility, non-tender              Adnexa: normal adnexa and no mass, fullness, tenderness                Rectovaginal: Confirms               Anus:  normal sphincter tone, no lesions  Chaperone, Octaviano Batty, CMA, was present for exam.  Assessment/Plan: 1. Well woman exam with routine gynecological exam - Pap smear with neg HR HPV 2021.  Pt desires yearly pap smears - Mammogram up to date - Colonoscopy 2020, follow up 5 years - Bone mineral density not ordered at this time - lab work done done with PCP - vaccines reviewed/updated  2. Ductal carcinoma in situ of left breast - followed by oncology at [facility]  3. Fibroids, intramural - stable exam  4. Family history of uterine cancer - had negative testing.  Pt knows to call if she has any vaginal bleeding

## 2022-02-25 LAB — CYTOLOGY - PAP: Diagnosis: NEGATIVE

## 2022-03-02 ENCOUNTER — Encounter (HOSPITAL_BASED_OUTPATIENT_CLINIC_OR_DEPARTMENT_OTHER): Payer: Self-pay | Admitting: Obstetrics & Gynecology

## 2022-03-26 ENCOUNTER — Ambulatory Visit (INDEPENDENT_AMBULATORY_CARE_PROVIDER_SITE_OTHER): Payer: BC Managed Care – PPO | Admitting: Nurse Practitioner

## 2022-03-26 ENCOUNTER — Encounter: Payer: Self-pay | Admitting: Nurse Practitioner

## 2022-03-26 VITALS — BP 109/83 | HR 80 | Temp 97.4°F | Ht 65.0 in | Wt 141.6 lb

## 2022-03-26 DIAGNOSIS — Z136 Encounter for screening for cardiovascular disorders: Secondary | ICD-10-CM

## 2022-03-26 DIAGNOSIS — Z13220 Encounter for screening for lipoid disorders: Secondary | ICD-10-CM

## 2022-03-26 DIAGNOSIS — Z0000 Encounter for general adult medical examination without abnormal findings: Secondary | ICD-10-CM | POA: Diagnosis not present

## 2022-03-26 LAB — LIPID PANEL
Cholesterol: 171 mg/dL (ref 0–200)
HDL: 63.6 mg/dL (ref >39.00)
LDL Cholesterol: 97 mg/dL (ref 0–99)
NonHDL: 107.73
Total CHOL/HDL Ratio: 3
Triglycerides: 53 mg/dL (ref 0.0–149.0)
VLDL: 10.6 mg/dL (ref 0.0–40.0)

## 2022-03-26 LAB — COMPREHENSIVE METABOLIC PANEL
ALT: 19 U/L (ref 0–35)
AST: 20 U/L (ref 0–37)
Albumin: 4.2 g/dL (ref 3.5–5.2)
Alkaline Phosphatase: 67 U/L (ref 39–117)
BUN: 12 mg/dL (ref 6–23)
CO2: 31 mEq/L (ref 19–32)
Calcium: 9.2 mg/dL (ref 8.4–10.5)
Chloride: 105 mEq/L (ref 96–112)
Creatinine, Ser: 0.68 mg/dL (ref 0.40–1.20)
GFR: 98.07 mL/min (ref >60.00)
Glucose, Bld: 87 mg/dL (ref 70–99)
Potassium: 3.8 mEq/L (ref 3.5–5.1)
Sodium: 143 mEq/L (ref 135–145)
Total Bilirubin: 0.6 mg/dL (ref 0.2–1.2)
Total Protein: 7.1 g/dL (ref 6.0–8.3)

## 2022-03-26 LAB — CBC
HCT: 41 % (ref 36.0–46.0)
Hemoglobin: 14.2 g/dL (ref 12.0–15.0)
MCHC: 34.7 g/dL (ref 30.0–36.0)
MCV: 90.8 fl (ref 78.0–100.0)
Platelets: 293 10*3/uL (ref 150.0–400.0)
RBC: 4.51 Mil/uL (ref 3.87–5.11)
RDW: 12.1 % (ref 11.5–15.5)
WBC: 4.8 10*3/uL (ref 4.0–10.5)

## 2022-03-26 NOTE — Patient Instructions (Signed)
Go to lab Schedule appt for eye exam Continue to monitor BP 2x/week Call office if BP>140/90.  Preventive Care 6-55 Years Old, Female Preventive care refers to lifestyle choices and visits with your health care provider that can promote health and wellness. Preventive care visits are also called wellness exams. What can I expect for my preventive care visit? Counseling Your health care provider may ask you questions about your: Medical history, including: Past medical problems. Family medical history. Pregnancy history. Current health, including: Menstrual cycle. Method of birth control. Emotional well-being. Home life and relationship well-being. Sexual activity and sexual health. Lifestyle, including: Alcohol, nicotine or tobacco, and drug use. Access to firearms. Diet, exercise, and sleep habits. Work and work Statistician. Sunscreen use. Safety issues such as seatbelt and bike helmet use. Physical exam Your health care provider will check your: Height and weight. These may be used to calculate your BMI (body mass index). BMI is a measurement that tells if you are at a healthy weight. Waist circumference. This measures the distance around your waistline. This measurement also tells if you are at a healthy weight and may help predict your risk of certain diseases, such as type 2 diabetes and high blood pressure. Heart rate and blood pressure. Body temperature. Skin for abnormal spots. What immunizations do I need?  Vaccines are usually given at various ages, according to a schedule. Your health care provider will recommend vaccines for you based on your age, medical history, and lifestyle or other factors, such as travel or where you work. What tests do I need? Screening Your health care provider may recommend screening tests for certain conditions. This may include: Lipid and cholesterol levels. Diabetes screening. This is done by checking your blood sugar (glucose) after  you have not eaten for a while (fasting). Pelvic exam and Pap test. Hepatitis B test. Hepatitis C test. HIV (human immunodeficiency virus) test. STI (sexually transmitted infection) testing, if you are at risk. Lung cancer screening. Colorectal cancer screening. Mammogram. Talk with your health care provider about when you should start having regular mammograms. This may depend on whether you have a family history of breast cancer. BRCA-related cancer screening. This may be done if you have a family history of breast, ovarian, tubal, or peritoneal cancers. Bone density scan. This is done to screen for osteoporosis. Talk with your health care provider about your test results, treatment options, and if necessary, the need for more tests. Follow these instructions at home: Eating and drinking  Eat a diet that includes fresh fruits and vegetables, whole grains, lean protein, and low-fat dairy products. Take vitamin and mineral supplements as recommended by your health care provider. Do not drink alcohol if: Your health care provider tells you not to drink. You are pregnant, may be pregnant, or are planning to become pregnant. If you drink alcohol: Limit how much you have to 0-1 drink a day. Know how much alcohol is in your drink. In the [place] one drink equals one 12 oz bottle of beer (355 mL), one 5 oz glass of wine (148 mL), or one 1 oz glass of hard liquor (44 mL). Lifestyle Brush your teeth every morning and night with fluoride toothpaste. Floss one time each day. Exercise for at least 30 minutes 5 or more days each week. Do not use any products that contain nicotine or tobacco. These products include cigarettes, chewing tobacco, and vaping devices, such as e-cigarettes. If you need help quitting, ask your health care provider. Do not use  drugs. If you are sexually active, practice safe sex. Use a condom or other form of protection to prevent STIs. If you do not wish to become pregnant,  use a form of birth control. If you plan to become pregnant, see your health care provider for a prepregnancy visit. Take aspirin only as told by your health care provider. Make sure that you understand how much to take and what form to take. Work with your health care provider to find out whether it is safe and beneficial for you to take aspirin daily. Find healthy ways to manage stress, such as: Meditation, yoga, or listening to music. Journaling. Talking to a trusted person. Spending time with friends and family. Minimize exposure to UV radiation to reduce your risk of skin cancer. Safety Always wear your seat belt while driving or riding in a vehicle. Do not drive: If you have been drinking alcohol. Do not ride with someone who has been drinking. When you are tired or distracted. While texting. If you have been using any mind-altering substances or drugs. Wear a helmet and other protective equipment during sports activities. If you have firearms in your house, make sure you follow all gun safety procedures. Seek help if you have been physically or sexually abused. What's next? Visit your health care provider once a year for an annual wellness visit. Ask your health care provider how often you should have your eyes and teeth checked. Stay up to date on all vaccines. This information is not intended to replace advice given to you by your health care provider. Make sure you discuss any questions you have with your health care provider. Document Revised: 11/06/2020 Document Reviewed: 11/06/2020 Elsevier Patient Education  [place].

## 2022-03-26 NOTE — Progress Notes (Signed)
Complete physical exam  Patient: Betty Huber   DOB: 16-Oct-1965   56 y.o. Female  MRN: [id number] Visit Date: 03/26/2022  Subjective:    Chief Complaint  Patient presents with   Annual Exam    CPE Pt fasting  No concerns    Elica Almas is a 56 y.o. female who presents today for a complete physical exam. She reports consuming a low fat and low sodium diet.  Walking daily, 63mns each day  She generally feels well. She reports sleeping fairly well. She does not have additional problems to discuss today.  Vision:No Dental:Yes STD Screen:No  Most recent fall risk assessment:    01/28/2022   10:42 AM  Fall Risk   Falls in the past year? 0  Number falls in past yr: 0  Injury with Fall? 0   Most recent depression screenings:    03/26/2022   10:40 AM 02/23/2022   10:37 AM  PHQ 2/9 Scores  PHQ - 2 Score 0 0  PHQ- 9 Score 0     HPI  No problem-specific Assessment & Plan notes found for this encounter.   Past Medical History:  Diagnosis Date   Allergy    Anemia    Arm laceration    3rd grade   [place] [place]   DCIS   Past Surgical History:  Procedure Laterality Date   BREAST BIOPSY  10/2019   BREAST LUMPECTOMY     DILATATION & CURETTAGE/HYSTEROSCOPY WITH TRUECLEAR N/A 12/11/2013   Procedure: DILATATION & CURETTAGE/HYSTEROSCOPY WITH TRUCLEAR;  Surgeon: TAzalia Bilis MD;  [facility];  Service: Gynecology;  Laterality: N/A;   WISDOM TOOTH EXTRACTION  2008   Social History   Socioeconomic History   Marital status: Single    Spouse name: Not on file   Number of children: Not on file   Years of education: Not on file   Highest education level: Not on file  Occupational History   Not on file  Tobacco Use   Smoking status: Never   Smokeless tobacco: Never  Vaping Use   Vaping Use: Never used  Substance and Sexual Activity   Alcohol use: No    Alcohol/week: 0.0 standard drinks of alcohol   Drug use: No   Sexual activity: Not Currently     Partners: Male    Birth control/protection: Abstinence  Other Topics Concern   Not on file  Social History Narrative   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   She lives alone, no children.   Highest level of education:  B.S.   Social Determinants of Health   Financial Resource Strain: Not on file  Food Insecurity: Not on file  Transportation Needs: Not on file  Physical Activity: Not on file  Stress: Not on file  Social Connections: Not on file  Intimate Partner Violence: Not on file   Family Status  Relation Name Status   Mother  Deceased   Father  Deceased   MGM  Deceased   MGF  Deceased   PGM  Deceased   PGF  Deceased   Sister KDarcel Bayley  Brother LTruman HaywardAlive   Brother MLegrand ComoAlive   Neg Hx  (Not Specified)   Family History  Problem Relation Age of Onset   Hypertension Mother 777  Cancer Mother 533      uterine cancer & lung cancer   Hyperlipidemia Mother        Deceased   COPD  Mother    Hearing loss Mother    Hypertension Father        55s   Cancer Father        kidney   Hyperlipidemia Father        Deceased   Arthritis Father    Asthma Father    Hearing loss Father    Diabetes Maternal Grandmother    ADD / ADHD Maternal Grandmother    Emphysema Maternal Grandfather    COPD Maternal Grandfather    Heart disease Maternal Grandfather    Heart attack Maternal Grandfather    Heart attack Paternal Grandmother    Heart attack Paternal Grandfather    Colon cancer Neg Hx    Esophageal cancer Neg Hx    Rectal cancer Neg Hx    Stomach cancer Neg Hx    No Known Allergies  Patient Care Team: Deano Tomaszewski, Charlene Brooke, NP as PCP - General (Internal Medicine)   Medications: Outpatient Medications Prior to Visit  Medication Sig   Cholecalciferol 25 MCG (1000 UT) capsule Take by mouth.   No facility-administered medications prior to visit.    Review of Systems  Constitutional:  Negative for fever.  HENT:  Negative for congestion and sore  throat.   Eyes:        Negative for visual changes  Respiratory:  Negative for cough and shortness of breath.   Cardiovascular:  Negative for chest pain, palpitations and leg swelling.  Gastrointestinal:  Negative for blood in stool, constipation and diarrhea.  Genitourinary:  Negative for dysuria, frequency and urgency.  Musculoskeletal:  Negative for myalgias.  Skin:  Negative for rash.  Neurological:  Negative for dizziness and headaches.  Hematological:  Does not bruise/bleed easily.  Psychiatric/Behavioral:  Negative for suicidal ideas. The patient is not nervous/anxious.         Objective:  BP 109/83 Comment: Average home BP reading  Pulse 80   Temp (!) 97.4 F (36.3 C) (Temporal)   Ht '5\' 5"'$  (1.651 m)   Wt 141 lb 9.6 oz (64.2 kg)   LMP  (LMP Unknown) Comment: last menstrual cycle at age 67  SpO2 98%   BMI 23.56 kg/m    Wt Readings from Last 3 Encounters:  03/26/22 141 lb 9.6 oz (64.2 kg)  02/23/22 140 lb 3.2 oz (63.6 kg)  01/28/22 139 lb 9.6 oz (63.3 kg)    BP Readings from Last 3 Encounters:  03/26/22 109/83  02/23/22 129/79  01/28/22 (!) 140/82      Physical Exam Vitals reviewed.  Constitutional:      General: She is not in acute distress. HENT:     Right Ear: Tympanic membrane, ear canal and external ear normal.     Left Ear: Tympanic membrane, ear canal and external ear normal.     Nose: Nose normal.  Eyes:     General: No scleral icterus.    Extraocular Movements: Extraocular movements intact.     Conjunctiva/sclera: Conjunctivae normal.     Pupils: Pupils are equal, round, and reactive to light.  Neck:     Thyroid: No thyromegaly.  Cardiovascular:     Rate and Rhythm: Normal rate.     Pulses: Normal pulses.     Heart sounds: Normal heart sounds.  Pulmonary:     Effort: Pulmonary effort is normal.     Breath sounds: Normal breath sounds.  Chest:     Chest wall: No tenderness.  Abdominal:     General: Bowel sounds are normal.  There is no  distension.     Palpations: Abdomen is soft.     Tenderness: There is no abdominal tenderness.  Genitourinary:    Comments: Breast and pelvic exam deferred to GYN Musculoskeletal:        General: No tenderness. Normal range of motion.     Cervical back: Normal range of motion and neck supple.     Right lower leg: No edema.     Left lower leg: No edema.  Lymphadenopathy:     Cervical: No cervical adenopathy.  Skin:    General: Skin is warm and dry.  Neurological:     Mental Status: She is alert and oriented to person, place, and time.  Psychiatric:        Mood and Affect: Mood normal.        Behavior: Behavior normal.        Thought Content: Thought content normal.        Judgment: Judgment normal.     No results found for any visits on 03/26/22.    Assessment & Plan:    Routine Health Maintenance and Physical Exam  Immunization History  Administered Date(s) Administered   Influenza-Unspecified 04/22/2020, 03/02/2022   PFIZER(Purple Top)SARS-COV-2 Vaccination 08/24/2019, 09/14/2019, 04/22/2020, 03/04/2021, 03/02/2022   Tdap 11/21/2007, 12/16/2017   Zoster Recombinat (Shingrix) 02/18/2021, 09/01/2021    Health Maintenance  Topic Date Due   COVID-19 Vaccine (6 - Pfizer risk series) 04/27/2022   MAMMOGRAM  11/22/2022   COLONOSCOPY (Pts 45-82yr Insurance coverage will need to be confirmed)  07/06/2023   PAP SMEAR-Modifier  02/23/2025   TETANUS/TDAP  12/17/2027   INFLUENZA VACCINE  Completed   Hepatitis C Screening  Completed   HIV Screening  Completed   Zoster Vaccines- Shingrix  Completed   HPV VACCINES  Aged Out   Discussed health benefits of physical activity, and encouraged her to engage in regular exercise appropriate for her age and condition.  Problem List Items Addressed This Visit   None Visit Diagnoses     Preventative health care    -  Primary   Relevant Orders   CBC   Comprehensive metabolic panel   Encounter for lipid screening for cardiovascular  disease       Relevant Orders   Lipid panel      Return in about 1 year (around 03/27/2023) for CPE (fasting).     CWilfred Lacy NP

## 2022-07-09 ENCOUNTER — Encounter: Payer: Self-pay | Admitting: Nurse Practitioner

## 2022-07-09 ENCOUNTER — Ambulatory Visit: Payer: BC Managed Care – PPO | Admitting: Nurse Practitioner

## 2022-07-09 VITALS — BP 130/82 | HR 115 | Temp 98.3°F | Resp 16 | Ht 65.0 in | Wt 143.0 lb

## 2022-07-09 DIAGNOSIS — S29011A Strain of muscle and tendon of front wall of thorax, initial encounter: Secondary | ICD-10-CM

## 2022-07-09 MED ORDER — PREDNISONE 20 MG PO TABS: ORAL_TABLET | ORAL | 0 refills | Status: AC

## 2022-07-09 NOTE — Progress Notes (Signed)
Established Patient Visit  Patient: Betty Huber   DOB: 01-Aug-1965   57 y.o. Female  MRN: [id number] Visit Date: 07/09/2022  Subjective:    Chief Complaint  Patient presents with   Chest Pain    Right sternum pain    Chest Pain  This is a new problem. The current episode started 1 to 4 weeks ago. The onset quality is sudden. The problem occurs intermittently. The problem has been gradually improving. The pain is present in the lateral region and substernal region. The pain is moderate. The quality of the pain is described as dull. The pain does not radiate. Pertinent negatives include no claudication, cough, diaphoresis, dizziness, exertional chest pressure, fever, hemoptysis, irregular heartbeat, leg pain, lower extremity edema, malaise/fatigue, nausea, near-syncope, numbness, orthopnea, palpitations, PND, shortness of breath or weakness. The pain is aggravated by lifting, lifting arm and movement. She has tried NSAIDs for the symptoms. The treatment provided significant relief. There are no known risk factors.  Pertinent negatives for past medical history include no CAD, no hyperlipidemia, no hypertension, no stimulant use and no thyroid problem.  Right side Chest wall pain: onset 62monthago after lifting decoration boxes up to the attic, waxing and waning, worse if sleep on right side or lifting right arm,  She avoids to take high dose of NSAIDs because it causes facial swelling  Reviewed medical, surgical, and social history today  Medications: Outpatient Medications Prior to Visit  Medication Sig   Cholecalciferol 25 MCG (1000 UT) capsule Take by mouth.   No facility-administered medications prior to visit.   Reviewed past medical and social history.   ROS per HPI above      Objective:  BP 130/82 (BP Location: Left Arm, Patient Position: Sitting, Cuff Size: Normal)   Pulse (!) 115   Temp 98.3 F (36.8 C) (Temporal)   Resp 16   Ht 5' 5"$  (1.651 m)   Wt 143 lb  (64.9 kg)   LMP  (LMP Unknown) Comment: last menstrual cycle at age 89 SpO2 98%   BMI 23.80 kg/m      Physical Exam Vitals reviewed.  Constitutional:      General: She is not in acute distress. Cardiovascular:     Rate and Rhythm: Normal rate and regular rhythm.     Heart sounds: Normal heart sounds.  Pulmonary:     Effort: Pulmonary effort is normal.     Breath sounds: Normal breath sounds.  Chest:     Chest wall: Tenderness present. No mass, deformity, crepitus or edema. There is no dullness to percussion.    Musculoskeletal:     Right shoulder: Normal.     Right upper arm: Normal.     Right elbow: Normal.     Cervical back: Normal range of motion and neck supple.  Skin:    General: Skin is warm and dry.     Findings: No erythema or rash.  Neurological:     Mental Status: She is alert and oriented to person, place, and time.     No results found for any visits on 07/09/22.    Assessment & Plan:    Problem List Items Addressed This Visit   None Visit Diagnoses     Intercostal muscle strain, initial encounter    -  Primary   Relevant Medications   predniSONE (DELTASONE) 20 MG tablet     Start oral prednisone, take with  food Start chest wall stretch exercises Apply cold compress after exercise Ok to use tylenol 519m every 8hrs as needed for pain Refer to outpatient PT if no improvement in 6-8weeks.  Return if symptoms worsen or fail to improve.     CWilfred Lacy NP

## 2022-07-09 NOTE — Patient Instructions (Addendum)
Start oral prednisone, take with food Start chest wall exercise Apply cold compress after exercise Ok to use tylenol 560m every 8hrs as needed for pain  Pectoralis Major Tear Rehab Ask your health care provider which exercises are safe for you. Do exercises exactly as told by your health care provider and adjust them as directed. It is normal to feel mild stretching, pulling, tightness, or discomfort as you do these exercises. Stop right away if you feel sudden pain or your pain gets worse. Do not begin these exercises until told by your health care provider. Stretching and range-of-motion exercises These exercises warm up your muscles and joints and improve the movement and flexibility of your shoulder. These exercises can also help to relieve pain, numbness, and tingling. Pendulum In this shoulder exercise, you let the injured arm dangle toward the floor and then swing it back and forth like a clock pendulum. Stand near a wall or a surface that you can hold onto for balance. Bend at the waist and let your left / right arm hang straight down. Use your other arm to keep your balance. Relax your arm and shoulder muscles, and move your hips and your trunk so your left / right arm swings freely. Your arm should swing because of the motion of your body, not because you are using your arm or shoulder muscles. Keep moving your hips and trunk so your arm swings in the following directions, as told by your health care provider: Side to side. Forward and backward. In clockwise and counterclockwise circles. Slowly return to the starting position. Repeat _____10____ times. Complete this exercise _____1_____ times a day. Standing shoulder abduction, passive In this exercise, the injured shoulder relaxes (passive) while you use the healthy arm to push it away from your body (abduction). Stand and hold a broomstick, a cane, or a similar object. Place your hands a little more than shoulder width apart on the  object. Your left / right hand should be palm-up, and your other hand should be palm-down. While keeping your elbow straight and your shoulder muscles relaxed, push the stick across your body toward your left / right side. Raise your left / right arm to the side of your body and then over your head until you feel a stretch in your shoulder. Stop when you reach the angle that is recommended by your health care provider. Avoid shrugging your shoulder while you raise your arm. Keep your shoulder blade tucked down toward the middle of your spine. Hold for _____5_____ seconds. Slowly return to the starting position. Repeat _____5_____ times. Complete this exercise ____1______ times a day. Supine wand shoulder flexion, passive In this exercise, the injured shoulder relaxes (passive) while you use the healthy arm to move it (flexion). Lie on your back (supine position). You may bend your knees for comfort. Hold a broomstick, a cane, or a similar object across your hips. Your hands should be shoulder width apart on the object, and your palms should face toward your feet. Raise your left / right arm in front of your face, then behind your head (toward the floor). Use your other hand to help you do this. Stop when you feel a gentle stretch in your shoulder, or when you reach the angle that is recommended by your health care provider. Hold for _____5_____ seconds. Use the stick and your other arm to help you return your left / right arm to the starting position. Repeat ____5______ times. Complete this exercise ____1______ times a day.  Wand shoulder external rotation, passive In this exercise, the injured shoulder relaxes (passive) while you use the healthy arm to push it away to your side (external rotation). Stand and hold a broomstick, a cane, or a similar object so your hands are about shoulder width apart on the object. Start with your arms hanging down, then bend both elbows to a 90-degree angle (right  angle). Keep your left / right elbow at your side. Use your other hand to push the stick so your left / right forearm moves away from your body, out to your side. Keep your left / right elbow bent to a right angle and keep it against your side. Stop when you feel a gentle stretch in your shoulder, or when you reach the angle recommended by your health care provider. Hold for _____5_____ seconds. Use the stick to help you return your left / right arm to the starting position. Repeat ____5______ times. Complete this exercise ______1____ times a day. Strengthening exercises These exercises build strength and endurance in your shoulder. Endurance is the ability to use your muscles for a long time, even after your muscles get tired. Scapular protraction, standing This exercise is also called wall push-ups. Stand so you are facing a wall. Place your feet about one arm's length away from the wall. Place your hands on the wall and straighten your elbows. Keep your hands on the wall as you push your upper back away from the wall. You should feel your shoulder blades (scapulae) sliding forward (protraction) around your rib cage. Keep your elbows and your head still. If you are not sure that you are doing this exercise correctly, ask your health care provider for more instructions. Hold for ____5______ seconds. Slowly return to the starting position. Let your muscles relax completely before you repeat this exercise. Repeat ____5______ times. Complete this exercise _____1_____ times a day. Scapular retraction, seated This exercise is also called shoulder blade squeezes. Sit with good posture in a stable chair without armrests. Do not let your back touch the back of the chair. Your arms should be at your sides with your elbows bent. You may rest your forearms on a pillow if that is more comfortable. Squeeze your shoulder blades (scapulae) together. Bring them down and back (retraction). Keep your shoulders  level. Do not lift your shoulders up toward your ears. Hold for _____5_____ seconds. Return to the starting position. Repeat ____5______ times. Complete this exercise _____1_____ times a day. This information is not intended to replace advice given to you by your health care provider. Make sure you discuss any questions you have with your health care provider. Document Revised: 09/23/2021 Document Reviewed: 09/23/2021 Elsevier Patient Education  [place].   Chest Wall Pain Chest wall pain is pain in or around the bones and muscles of your chest. Chest wall pain may be caused by: An injury. Coughing a lot. Using your chest and arm muscles too much. Sometimes, the cause may not be known. This pain may take a few weeks or longer to get better. Follow these instructions at home: Managing pain, stiffness, and swelling If told, put ice on the painful area: Put ice in a plastic bag. Place a towel between your skin and the bag. Leave the ice on for 20 minutes, 2-3 times a day.  Activity Rest as told by your doctor. Avoid doing things that cause pain. This includes lifting heavy items. Ask your doctor what activities are safe for you. General instructions  Take  over-the-counter and prescription medicines only as told by your doctor. Do not use any products that contain nicotine or tobacco, such as cigarettes, e-cigarettes, and chewing tobacco. If you need help quitting, ask your doctor. Keep all follow-up visits as told by your doctor. This is important. Contact a doctor if: You have a fever. Your chest pain gets worse. You have new symptoms. Get help right away if: You feel sick to your stomach (nauseous) or you throw up (vomit). You feel sweaty or light-headed. You have a cough with mucus from your lungs (sputum) or you cough up blood. You are short of breath. These symptoms may be an emergency. Do not wait to see if the symptoms will go away. Get medical help right away.  Call your local emergency services (911 in the [place] Do not drive yourself to the hospital. Summary Chest wall pain is pain in or around the bones and muscles of your chest. It may be treated with ice, rest, and medicines. Your condition may also get better if you avoid doing things that cause pain. Contact a doctor if you have a fever, chest pain that gets worse, or new symptoms. Get help right away if you feel light-headed or you get short of breath. These symptoms may be an emergency. This information is not intended to replace advice given to you by your health care provider. Make sure you discuss any questions you have with your health care provider. Document Revised: 08/13/2020 Document Reviewed: 07/26/2020 Elsevier Patient Education  [place].

## 2022-12-02 NOTE — Progress Notes (Unsigned)
    Betty Huber [facility] [address] [place] [address] Phone: [phone]   Assessment and Plan:     There are no diagnoses linked to this encounter.  ***   Pertinent previous records reviewed include ***   Follow Up: ***     Subjective:   I, Betty Huber, am serving as a Neurosurgeon for Doctor Richardean Sale  Chief Complaint: left wrist pain   HPI:   12/03/2022 Patient is a 57 year old female complaining of left wrist pain. Patient states  Relevant Historical Information: ***  Additional pertinent review of systems negative.   Current Outpatient Medications:    Cholecalciferol 25 MCG (1000 UT) capsule, Take by mouth., Disp: , Rfl:    Objective:     There were no vitals filed for this visit.    There is no height or weight on file to calculate BMI.    Physical Exam:    ***   Electronically signed by:  Betty Huber [facility] 7:13 AM 12/02/22

## 2022-12-03 ENCOUNTER — Ambulatory Visit: Payer: BC Managed Care – PPO | Admitting: Sports Medicine

## 2022-12-03 ENCOUNTER — Ambulatory Visit (INDEPENDENT_AMBULATORY_CARE_PROVIDER_SITE_OTHER): Payer: BC Managed Care – PPO

## 2022-12-03 VITALS — HR 115 | Ht 65.0 in | Wt 143.0 lb

## 2022-12-03 DIAGNOSIS — M654 Radial styloid tenosynovitis [de Quervain]: Secondary | ICD-10-CM | POA: Diagnosis not present

## 2022-12-03 DIAGNOSIS — M25532 Pain in left wrist: Secondary | ICD-10-CM

## 2022-12-03 MED ORDER — MELOXICAM 15 MG PO TABS: 15.0000 mg | ORAL_TABLET | Freq: Every day | ORAL | 0 refills | Status: DC

## 2022-12-03 NOTE — Patient Instructions (Signed)
-   Start meloxicam 15 mg daily x2 weeks.  If still having pain after 2 weeks, complete 3rd-week of meloxicam. May use remaining meloxicam as needed once daily for pain control.  Do not to use additional NSAIDs while taking meloxicam.  May use Tylenol [phone] mg 2 to 3 times a day for breakthrough pain. Thumb HEP  Recommend getting a thumb brace that does not limit wrist motion  Can try [employer] or other similar stores 4 week follow up

## 2022-12-31 ENCOUNTER — Ambulatory Visit: Payer: BC Managed Care – PPO | Admitting: Sports Medicine

## 2023-01-05 NOTE — Progress Notes (Unsigned)
    Betty [facility]er [facility] [address] [place] [address] Phone: [phone]   Assessment and Plan:     There are no diagnoses linked to this encounter.  ***   Pertinent previous records reviewed include ***   Follow Up: ***     Subjective:   I, Betty Huber, am serving as a Neurosurgeon for Doctor Richardean Sale   Chief Complaint: left wrist pain    HPI:    12/03/2022 Patient is a 57 year old female complaining of left wrist pain. Patient states  four months ago  I have had pain in my left wrist/hand (thumb side) when I grip, pinch, twist, etc. I am not experiencing any tingling sensations, just pain. Also, can't bend that wrist in some positions. I have tried conservative treatments, which have helped somewhat.  The issue started after I had pulled a chest muscle on my right side and had to rely. Tylenol and ibu when the pain is really bad and it helps but she doesn't like taking meds. She has had major improvement but its been months and its not where she wants the healing to be     01/06/2023 Patient states   Relevant Historical Information:  none pertinent  Additional pertinent review of systems negative.   Current Outpatient Medications:    Cholecalciferol 25 MCG (1000 UT) capsule, Take by mouth., Disp: , Rfl:    meloxicam (MOBIC) 15 MG tablet, Take 1 tablet (15 mg total) by mouth daily., Disp: 30 tablet, Rfl: 0   Objective:     There were no vitals filed for this visit.    There is no height or weight on file to calculate BMI.    Physical Exam:    ***   Electronically signed by:  Betty Huber [facility] 4:27 PM 01/05/23

## 2023-01-06 ENCOUNTER — Other Ambulatory Visit: Payer: Self-pay | Admitting: Sports Medicine

## 2023-01-06 ENCOUNTER — Other Ambulatory Visit: Payer: Self-pay

## 2023-01-06 ENCOUNTER — Ambulatory Visit: Payer: BC Managed Care – PPO | Admitting: Sports Medicine

## 2023-01-06 VITALS — BP 110/78 | HR 105 | Ht 65.0 in | Wt 143.0 lb

## 2023-01-06 DIAGNOSIS — M654 Radial styloid tenosynovitis [de Quervain]: Secondary | ICD-10-CM

## 2023-01-06 DIAGNOSIS — M25532 Pain in left wrist: Secondary | ICD-10-CM

## 2023-01-06 DIAGNOSIS — M85842 Other specified disorders of bone density and structure, left hand: Secondary | ICD-10-CM

## 2023-01-06 DIAGNOSIS — R768 Other specified abnormal immunological findings in serum: Secondary | ICD-10-CM | POA: Diagnosis not present

## 2023-01-06 LAB — C-REACTIVE PROTEIN: CRP: <1 mg/dL (ref 0.5–20.0)

## 2023-01-06 LAB — SEDIMENTATION RATE: Sed Rate: 15 mm/hr (ref 0–30)

## 2023-01-06 LAB — FERRITIN: Ferritin: 36.5 ng/mL (ref 10.0–291.0)

## 2023-01-06 LAB — URIC ACID: Uric Acid, Serum: 4 mg/dL (ref 2.4–7.0)

## 2023-01-06 LAB — TSH: TSH: 1.31 u[IU]/mL (ref 0.35–5.50)

## 2023-01-06 LAB — VITAMIN D 25 HYDROXY (VIT D DEFICIENCY, FRACTURES): VITD: 42.2 ng/mL (ref 30.00–100.00)

## 2023-01-06 NOTE — Patient Instructions (Signed)
PT referral  Labs on the way out  No CBC CMP 4 week follow up

## 2023-01-09 LAB — ANTI-NUCLEAR AB-TITER (ANA TITER): ANA Titer 1: 1:80 {titer} — ABNORMAL HIGH

## 2023-01-09 LAB — CYCLIC CITRUL PEPTIDE ANTIBODY, IGG: Cyclic Citrullin Peptide Ab: <16 U

## 2023-01-09 LAB — ANA: Anti Nuclear Antibody (ANA): POSITIVE — AB

## 2023-01-09 LAB — ANGIOTENSIN CONVERTING ENZYME: Angiotensin-Converting Enzyme: 24 U/L (ref 9–67)

## 2023-01-09 LAB — RHEUMATOID FACTOR: Rheumatoid fact SerPl-aCnc: 10 [IU]/mL (ref <14)

## 2023-01-11 NOTE — Addendum Note (Signed)
Addended by: Evon Slack on: 01/11/2023 09:40 AM   Modules accepted: Orders

## 2023-02-03 NOTE — Progress Notes (Unsigned)
    Bet[facility] Huber [facility] [address] [place] [address] Phone: [phone]   Assessment and Plan:     There are no diagnoses linked to this encounter.  ***   Pertinent previous records reviewed include ***   Follow Up: ***     Subjective:   I, Betty Huber, am serving as a Neurosurgeon for Betty Huber   Chief Complaint: left wrist pain    HPI:    12/03/2022 Patient is a 57 year old female complaining of left wrist pain. Patient states  four months ago  I have had pain in my left wrist/hand (thumb side) when I grip, pinch, twist, etc. I am not experiencing any tingling sensations, just pain. Also, can't bend that wrist in some positions. I have tried conservative treatments, which have helped somewhat.  The issue started after I had pulled a chest muscle on my right side and had to rely. Tylenol and ibu when the pain is really bad and it helps but she doesn't like taking meds. She has had major improvement but its been months and its not where she wants the healing to be      01/06/2023 Patient states that she has had some improvement . Once she stopped medicine pain has come back but not as bad. Still has pain with smaller grip and lift . Still has pain when she lays down on one side. Still has decreased ROM.    02/04/2023 Patient states    Relevant Historical Information:  none pertinent Additional pertinent review of systems negative.   Current Outpatient Medications:    Cholecalciferol 25 MCG (1000 UT) capsule, Take by mouth., Disp: , Rfl:    meloxicam (MOBIC) 15 MG tablet, Take 1 tablet (15 mg total) by mouth daily., Disp: 30 tablet, Rfl: 0   Objective:     There were no vitals filed for this visit.    There is no height or weight on file to calculate BMI.    Physical Exam:    ***   Electronically signed by:  Betty Huber [facility] 7:19 AM 02/03/23

## 2023-02-04 ENCOUNTER — Ambulatory Visit: Payer: BC Managed Care – PPO | Admitting: Sports Medicine

## 2023-02-04 VITALS — BP 122/78 | HR 101 | Ht 65.0 in | Wt 145.0 lb

## 2023-02-04 DIAGNOSIS — M85842 Other specified disorders of bone density and structure, left hand: Secondary | ICD-10-CM

## 2023-02-04 DIAGNOSIS — M25532 Pain in left wrist: Secondary | ICD-10-CM | POA: Diagnosis not present

## 2023-02-04 DIAGNOSIS — R768 Other specified abnormal immunological findings in serum: Secondary | ICD-10-CM | POA: Diagnosis not present

## 2023-02-04 DIAGNOSIS — M654 Radial styloid tenosynovitis [de Quervain]: Secondary | ICD-10-CM

## 2023-03-01 ENCOUNTER — Ambulatory Visit (HOSPITAL_BASED_OUTPATIENT_CLINIC_OR_DEPARTMENT_OTHER): Payer: BC Managed Care – PPO | Admitting: Obstetrics & Gynecology

## 2023-03-03 ENCOUNTER — Encounter (HOSPITAL_BASED_OUTPATIENT_CLINIC_OR_DEPARTMENT_OTHER): Payer: Self-pay | Admitting: Obstetrics & Gynecology

## 2023-03-29 ENCOUNTER — Encounter: Payer: Self-pay | Admitting: Nurse Practitioner

## 2023-03-29 ENCOUNTER — Ambulatory Visit (INDEPENDENT_AMBULATORY_CARE_PROVIDER_SITE_OTHER): Payer: BC Managed Care – PPO | Admitting: Nurse Practitioner

## 2023-03-29 VITALS — BP 120/76 | HR 105 | Temp 97.7°F | Resp 18 | Ht 65.0 in | Wt 141.8 lb

## 2023-03-29 DIAGNOSIS — Z0000 Encounter for general adult medical examination without abnormal findings: Secondary | ICD-10-CM | POA: Diagnosis not present

## 2023-03-29 DIAGNOSIS — R768 Other specified abnormal immunological findings in serum: Secondary | ICD-10-CM | POA: Insufficient documentation

## 2023-03-29 LAB — COMPREHENSIVE METABOLIC PANEL
ALT: 20 U/L (ref 0–35)
AST: 19 U/L (ref 0–37)
Albumin: 4.4 g/dL (ref 3.5–5.2)
Alkaline Phosphatase: 68 U/L (ref 39–117)
BUN: 10 mg/dL (ref 6–23)
CO2: 31 meq/L (ref 19–32)
Calcium: 9.7 mg/dL (ref 8.4–10.5)
Chloride: 103 meq/L (ref 96–112)
Creatinine, Ser: 0.66 mg/dL (ref 0.40–1.20)
GFR: 98.08 mL/min (ref >60.00)
Glucose, Bld: 89 mg/dL (ref 70–99)
Potassium: 4.3 meq/L (ref 3.5–5.1)
Sodium: 143 meq/L (ref 135–145)
Total Bilirubin: 0.7 mg/dL (ref 0.2–1.2)
Total Protein: 7.4 g/dL (ref 6.0–8.3)

## 2023-03-29 NOTE — Progress Notes (Signed)
Complete physical exam  Patient: Betty Huber   DOB: 15-Nov-1965   57 y.o. Female  MRN: [id number] Visit Date: 03/29/2023  Subjective:    Chief Complaint  Patient presents with   Annual Exam    PT is here for annual exam    Betty Huber is a 57 y.o. female who presents today for a complete physical exam. She reports consuming a general diet.  Walking daily  She generally feels well. She reports sleeping well. She does not have additional problems to discuss today.  Vision:Yes Dental:Yes STD Screen:No  BP Readings from Last 3 Encounters:  03/29/23 120/76  02/04/23 122/78  01/06/23 110/78   Wt Readings from Last 3 Encounters:  03/29/23 141 lb 12.8 oz (64.3 kg)  02/04/23 145 lb (65.8 kg)  01/06/23 143 lb (64.9 kg)    Most recent fall risk assessment:    03/29/2023   10:46 AM  Fall Risk   Falls in the past year? 0  Number falls in past yr: 0  Injury with Fall? 0  Risk for fall due to : No Fall Risks  Follow up Falls evaluation completed     Depression screen:Yes - No Depression Most recent depression screenings:    03/29/2023   10:46 AM 07/09/2022    8:07 AM  PHQ 2/9 Scores  PHQ - 2 Score 0 0  PHQ- 9 Score 1     HPI  Elevated antinuclear antibody (ANA) level ANA titer 1:80 Normal ESR, CRP, ACE, THYROID, iron, CCP, RH factor No generalized pain or malaise or fatigue or joint swelling/redness Left wrist DG: Periarticular osteopenia. This is nonspecific but can be seen in the earliest manifestation of inflammatory arthropathy. Recommend correlation with laboratory values. 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   Past Surgical History:  Procedure Laterality Date   BREAST BIOPSY  10/2019   BREAST LUMPECTOMY     DILATATION & CURETTAGE/HYSTEROSCOPY WITH TRUECLEAR N/A 12/11/2013   Procedure: DILATATION & CURETTAGE/HYSTEROSCOPY  WITH TRUCLEAR;  Surgeon: Bennye Alm, MD;  Location: [facility];  Service: Gynecology;  Laterality: N/A;   WISDOM TOOTH EXTRACTION  2008   Social History   Socioeconomic History   Marital status: Single    Spouse name: Not on file   Number of children: Not on file   Years of education: Not on file   Highest education level: Not on file  Occupational History   Not on file  Tobacco Use   Smoking status: Never   Smokeless tobacco: Never  Vaping Use   Vaping status: Never Used  Substance and Sexual Activity   Alcohol use: No    Alcohol/week: 0.0 standard drinks of alcohol   Drug use: No   Sexual activity: Not Currently    Partners: Male    Birth control/protection: Abstinence  Other Topics Concern   Not on file  Social History Narrative   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   She lives alone, no children.   Highest level of education:  B.S.   Social Determinants of Health   Financial Resource Strain: Not on file  Food Insecurity: Not on file  Transportation Needs: Not on file  Physical Activity: Not on file  Stress: Not on file  Social Connections: Not on file  Intimate Partner Violence:  Not on file   Family Status  Relation Name Status   Mother  Deceased   Father  Deceased   MGM  Deceased   MGF  Deceased   PGM  Deceased   PGF  Deceased   Sister Tresea Mall   Brother Nedra Hai Alive   Brother Casimiro Needle Alive   Neg Hx  (Not Specified)  No partnership data on file   Family History  Problem Relation Age of Onset   Hypertension Mother 73   Cancer Mother 50       uterine cancer & lung cancer   Hyperlipidemia Mother        Deceased   COPD Mother    Hearing loss Mother    Hypertension Father        21s   Cancer Father        kidney   Hyperlipidemia Father        Deceased   Arthritis Father    Asthma Father    Hearing loss Father    Diabetes Maternal Grandmother    ADD / ADHD Maternal Grandmother    Emphysema Maternal Grandfather    COPD  Maternal Grandfather    Heart disease Maternal Grandfather    Heart attack Maternal Grandfather    Heart attack Paternal Grandmother    Heart attack Paternal Grandfather    Colon cancer Neg Hx    Esophageal cancer Neg Hx    Rectal cancer Neg Hx    Stomach cancer Neg Hx    No Known Allergies  Patient Care Team: Raylan Troiani, Bonna Gains, NP as PCP - General (Internal Medicine)   Medications: Outpatient Medications Prior to Visit  Medication Sig   Cholecalciferol 25 MCG (1000 UT) capsule Take by mouth.   loratadine (CLARITIN) 10 MG tablet Take by mouth.   Magnesium Citrate 100 MG TABS 100 mg Once Daily.   [DISCONTINUED] meloxicam (MOBIC) 15 MG tablet Take 1 tablet (15 mg total) by mouth daily.   No facility-administered medications prior to visit.    Review of Systems  Constitutional:  Negative for activity change, appetite change and unexpected weight change.  Respiratory: Negative.    Cardiovascular: Negative.   Gastrointestinal: Negative.   Endocrine: Negative for cold intolerance and heat intolerance.  Genitourinary: Negative.   Musculoskeletal: Negative.   Skin: Negative.   Neurological: Negative.   Hematological: Negative.   Psychiatric/Behavioral:  Negative for behavioral problems, decreased concentration, dysphoric mood, hallucinations, self-injury, sleep disturbance and suicidal ideas. The patient is not nervous/anxious.        Objective:  BP 120/76   Pulse (!) 105   Temp 97.7 F (36.5 C) (Temporal)   Resp 18   Ht 5\' 5"  (1.651 m)   Wt 141 lb 12.8 oz (64.3 kg)   LMP  (LMP Unknown) Comment: last menstrual cycle at age 55  SpO2 100%   BMI 23.60 kg/m     Physical Exam Vitals and nursing note reviewed.  Constitutional:      General: She is not in acute distress. HENT:     Right Ear: Tympanic membrane, ear canal and external ear normal.     Left Ear: Tympanic membrane, ear canal and external ear normal.     Nose: Nose normal.  Eyes:     Extraocular Movements:  Extraocular movements intact.     Conjunctiva/sclera: Conjunctivae normal.     Pupils: Pupils are equal, round, and reactive to light.  Neck:     Thyroid: No thyroid mass, thyromegaly or thyroid  tenderness.  Cardiovascular:     Rate and Rhythm: Normal rate and regular rhythm.     Pulses: Normal pulses.     Heart sounds: Normal heart sounds.  Pulmonary:     Effort: Pulmonary effort is normal.     Breath sounds: Normal breath sounds.  Abdominal:     General: Bowel sounds are normal.     Palpations: Abdomen is soft.  Musculoskeletal:        General: Normal range of motion.     Cervical back: Normal range of motion and neck supple.     Right lower leg: No edema.     Left lower leg: No edema.  Lymphadenopathy:     Cervical: No cervical adenopathy.  Skin:    General: Skin is warm and dry.  Neurological:     Mental Status: She is alert and oriented to person, place, and time.     Cranial Nerves: No cranial nerve deficit.  Psychiatric:        Mood and Affect: Mood normal.        Behavior: Behavior normal.        Thought Content: Thought content normal.      No results found for any visits on 03/29/23.    Assessment & Plan:    Routine Health Maintenance and Physical Exam  Immunization History  Administered Date(s) Administered   Influenza-Unspecified 04/22/2020, 03/02/2022, 03/02/2023   PFIZER(Purple Top)SARS-COV-2 Vaccination 08/24/2019, 09/14/2019, 04/22/2020, 03/04/2021, 03/02/2022   Pfizer Covid-19 Vaccine Bivalent Booster 85yrs & up 03/02/2023   Tdap 11/21/2007, 12/16/2017   Zoster Recombinant(Shingrix) 02/18/2021, 09/01/2021    Health Maintenance  Topic Date Due   Colonoscopy  07/06/2023   COVID-19 Vaccine (7 - 2023-24 season) 04/27/2023   MAMMOGRAM  01/04/2025   Cervical Cancer Screening (HPV/Pap Cotest)  02/23/2025   DTaP/Tdap/Td (3 - Td or Tdap) 12/17/2027   INFLUENZA VACCINE  Completed   Hepatitis C Screening  Completed   HIV Screening  Completed   Zoster  Vaccines- Shingrix  Completed   HPV VACCINES  Aged Out   Discussed health benefits of physical activity, and encouraged her to engage in regular exercise appropriate for her age and condition. Advised to schedule upcoming colonoscopy.  Problem List Items Addressed This Visit     Elevated antinuclear antibody (ANA) level    ANA titer 1:80 Normal ESR, CRP, ACE, THYROID, iron, CCP, RH factor No generalized pain or malaise or fatigue or joint swelling/redness Left wrist DG: Periarticular osteopenia. This is nonspecific but can be seen in the earliest manifestation of inflammatory arthropathy. Recommend correlation with laboratory values. She has pending appointment with rheumatology      Other Visit Diagnoses     Preventative health care    -  Primary   Relevant Orders   Comprehensive metabolic panel      Return in about 1 year (around 03/28/2024) for CPE (fasting).     Alysia Penna, NP

## 2023-03-29 NOTE — Patient Instructions (Signed)
Schedule appointment with GI for repeat colonoscopy Go to lab  Preventive Care 21-56 Years Old, Female Preventive care refers to lifestyle choices and visits with your health care provider that can promote health and wellness. Preventive care visits are also called wellness exams. What can I expect for my preventive care visit? Counseling Your health care provider may ask you questions about your: Medical history, including: Past medical problems. Family medical history. Pregnancy history. Current health, including: Menstrual cycle. Method of birth control. Emotional well-being. Home life and relationship well-being. Sexual activity and sexual health. Lifestyle, including: Alcohol, nicotine or tobacco, and drug use. Access to firearms. Diet, exercise, and sleep habits. Work and work Astronomer. Sunscreen use. Safety issues such as seatbelt and bike helmet use. Physical exam Your health care provider will check your: Height and weight. These may be used to calculate your BMI (body mass index). BMI is a measurement that tells if you are at a healthy weight. Waist circumference. This measures the distance around your waistline. This measurement also tells if you are at a healthy weight and may help predict your risk of certain diseases, such as type 2 diabetes and high blood pressure. Heart rate and blood pressure. Body temperature. Skin for abnormal spots. What immunizations do I need?  Vaccines are usually given at various ages, according to a schedule. Your health care provider will recommend vaccines for you based on your age, medical history, and lifestyle or other factors, such as travel or where you work. What tests do I need? Screening Your health care provider may recommend screening tests for certain conditions. This may include: Lipid and cholesterol levels. Diabetes screening. This is done by checking your blood sugar (glucose) after you have not eaten for a while  (fasting). Pelvic exam and Pap test. Hepatitis B test. Hepatitis C test. HIV (human immunodeficiency virus) test. STI (sexually transmitted infection) testing, if you are at risk. Lung cancer screening. Colorectal cancer screening. Mammogram. Talk with your health care provider about when you should start having regular mammograms. This may depend on whether you have a family history of breast cancer. BRCA-related cancer screening. This may be done if you have a family history of breast, ovarian, tubal, or peritoneal cancers. Bone density scan. This is done to screen for osteoporosis. Talk with your health care provider about your test results, treatment options, and if necessary, the need for more tests. Follow these instructions at home: Eating and drinking  Eat a diet that includes fresh fruits and vegetables, whole grains, lean protein, and low-fat dairy products. Take vitamin and mineral supplements as recommended by your health care provider. Do not drink alcohol if: Your health care provider tells you not to drink. You are pregnant, may be pregnant, or are planning to become pregnant. If you drink alcohol: Limit how much you have to 0-1 drink a day. Know how much alcohol is in your drink. In the [place] one drink equals one 12 oz bottle of beer (355 mL), one 5 oz glass of wine (148 mL), or one 1 oz glass of hard liquor (44 mL). Lifestyle Brush your teeth every morning and night with fluoride toothpaste. Floss one time each day. Exercise for at least 30 minutes 5 or more days each week. Do not use any products that contain nicotine or tobacco. These products include cigarettes, chewing tobacco, and vaping devices, such as e-cigarettes. If you need help quitting, ask your health care provider. Do not use drugs. If you are sexually active, practice  safe sex. Use a condom or other form of protection to prevent STIs. If you do not wish to become pregnant, use a form of birth control. If  you plan to become pregnant, see your health care provider for a prepregnancy visit. Take aspirin only as told by your health care provider. Make sure that you understand how much to take and what form to take. Work with your health care provider to find out whether it is safe and beneficial for you to take aspirin daily. Find healthy ways to manage stress, such as: Meditation, yoga, or listening to music. Journaling. Talking to a trusted person. Spending time with friends and family. Minimize exposure to UV radiation to reduce your risk of skin cancer. Safety Always wear your seat belt while driving or riding in a vehicle. Do not drive: If you have been drinking alcohol. Do not ride with someone who has been drinking. When you are tired or distracted. While texting. If you have been using any mind-altering substances or drugs. Wear a helmet and other protective equipment during sports activities. If you have firearms in your house, make sure you follow all gun safety procedures. Seek help if you have been physically or sexually abused. What's next? Visit your health care provider once a year for an annual wellness visit. Ask your health care provider how often you should have your eyes and teeth checked. Stay up to date on all vaccines. This information is not intended to replace advice given to you by your health care provider. Make sure you discuss any questions you have with your health care provider. Document Revised: 11/06/2020 Document Reviewed: 11/06/2020 Elsevier Patient Education  2024 [employer].

## 2023-03-29 NOTE — Assessment & Plan Note (Signed)
ANA titer 1:80 Normal ESR, CRP, ACE, THYROID, iron, CCP, RH factor No generalized pain or malaise or fatigue or joint swelling/redness Left wrist DG: Periarticular osteopenia. This is nonspecific but can be seen in the earliest manifestation of inflammatory arthropathy. Recommend correlation with laboratory values. She has pending appointment with rheumatology

## 2023-04-26 ENCOUNTER — Encounter (HOSPITAL_BASED_OUTPATIENT_CLINIC_OR_DEPARTMENT_OTHER): Payer: Self-pay | Admitting: Obstetrics & Gynecology

## 2023-04-26 ENCOUNTER — Ambulatory Visit (HOSPITAL_BASED_OUTPATIENT_CLINIC_OR_DEPARTMENT_OTHER): Payer: BC Managed Care – PPO | Admitting: Obstetrics & Gynecology

## 2023-04-26 VITALS — BP 124/80 | HR 103 | Ht 65.0 in | Wt 142.8 lb

## 2023-04-26 DIAGNOSIS — Z01419 Encounter for gynecological examination (general) (routine) without abnormal findings: Secondary | ICD-10-CM

## 2023-04-26 DIAGNOSIS — D0512 Intraductal carcinoma in situ of left breast: Secondary | ICD-10-CM

## 2023-04-26 DIAGNOSIS — D251 Intramural leiomyoma of uterus: Secondary | ICD-10-CM | POA: Diagnosis not present

## 2023-04-26 DIAGNOSIS — R030 Elevated blood-pressure reading, without diagnosis of hypertension: Secondary | ICD-10-CM

## 2023-04-26 NOTE — Progress Notes (Signed)
57 y.o. G0P0000 Single White or Caucasian female here for annual exam.  Doing well.  Denies vaginal bleeding.  Having some left hand tendonitis.  Saw sports medicine and had some PT and steroid injection.  This is much better.  But did have +ANA and will be seeing a rheumatologist.    No LMP recorded (lmp unknown). Patient is postmenopausal.          Sexually active: No.   Health Maintenance: Pap:  02/23/2022 Negative History of abnormal Pap:  no MMG:  01/05/2023 Colonoscopy:  07/05/2018, follow up 5 years BMD:   around age 85 Screening Labs: done early November   reports that she has never smoked. She has never used smokeless tobacco. She reports that she does not drink alcohol and does not use drugs.  Past Medical History:  Diagnosis Date   Allergy    Anemia    Arm laceration    3rd grade   Breast cancer (HCC) 2017   DCIS    Past Surgical History:  Procedure Laterality Date   BREAST BIOPSY  10/2019   BREAST LUMPECTOMY     DILATATION & CURETTAGE/HYSTEROSCOPY WITH TRUECLEAR N/A 12/11/2013   Procedure: DILATATION & CURETTAGE/HYSTEROSCOPY WITH TRUCLEAR;  Surgeon: Bennye Alm, MD;  Location: [facility];  Service: Gynecology;  Laterality: N/A;   WISDOM TOOTH EXTRACTION  2008    Current Outpatient Medications  Medication Sig Dispense Refill   Cholecalciferol 25 MCG (1000 UT) capsule Take by mouth.     loratadine (CLARITIN) 10 MG tablet Take by mouth.     Magnesium Citrate 100 MG TABS 100 mg Once Daily.     No current facility-administered medications for this visit.    Family History  Problem Relation Age of Onset   Hypertension Mother 64   Cancer Mother 66       uterine cancer & lung cancer   Hyperlipidemia Mother        Deceased   COPD Mother    Hearing loss Mother    Hypertension Father        46s   Cancer Father        kidney   Hyperlipidemia Father        Deceased   Arthritis Father    Asthma Father    Hearing loss Father    Diabetes Maternal Grandmother     ADD / ADHD Maternal Grandmother    Emphysema Maternal Grandfather    COPD Maternal Grandfather    Heart disease Maternal Grandfather    Heart attack Maternal Grandfather    Heart attack Paternal Grandmother    Heart attack Paternal Grandfather    Colon cancer Neg Hx    Esophageal cancer Neg Hx    Rectal cancer Neg Hx    Stomach cancer Neg Hx     ROS: Constitutional: negative Genitourinary:negative  Exam:   BP 124/80   Pulse (!) 103   Ht 5\' 5"  (1.651 m)   Wt 142 lb 12.8 oz (64.8 kg)   LMP  (LMP Unknown) Comment: last menstrual cycle at age 27  BMI 23.76 kg/m   Height: 5\' 5"  (165.1 cm)  General appearance: alert, cooperative and appears stated age Head: Normocephalic, without obvious abnormality, atraumatic Neck: no adenopathy, supple, symmetrical, trachea midline and thyroid normal to inspection and palpation Lungs: clear to auscultation bilaterally Breasts: normal appearance, no masses or tenderness, minimal radiation changes left breast Heart: regular rate and rhythm Abdomen: soft, non-tender; bowel sounds normal; no masses,  no organomegaly  Extremities: extremities normal, atraumatic, no cyanosis or edema Skin: Skin color, texture, turgor normal. No rashes or lesions Lymph nodes: Cervical, supraclavicular, and axillary nodes normal. No abnormal inguinal nodes palpated Neurologic: Grossly normal   Pelvic: External genitalia:  no lesions              Urethra:  normal appearing urethra with no masses, tenderness or lesions              Bartholins and Skenes: normal                 Vagina: normal appearing vagina with normal color and no discharge, no lesions              Cervix: no lesions              Pap taken: No. Bimanual Exam:  Uterus:  normal size, contour, position, consistency, mobility, non-tender              Adnexa: normal adnexa and no mass, fullness, tenderness               Rectovaginal: Confirms               Anus:  normal sphincter tone, no  lesions  Chaperone, Ina Homes, CMA, was present for exam.  Assessment/Plan: 1. Well woman exam with routine gynecological exam - Pap smear neg with neg HR HPV 2023.  Not indicated.  - Mammogram 2024 with [facility]. - Colonoscopy 2020.  Follow up 5 years. - Bone mineral density will be planned around age 39. - lab work done with PCP, Elliot Dally - vaccines reviewed/updated  2. 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  Doing screening mammograms at this point.  3. Intramural leiomyoma of uterus - stable exam today.

## 2023-05-31 NOTE — Progress Notes (Deleted)
Office Visit Note  Patient: Betty Huber             Date of Birth: 10-19-1965           MRN: [id number]             PCP: Anne Ng, NP Referring: Richardean Sale, DO Visit Date: 06/14/2023 Occupation: @GUAROCC @  Subjective:  No chief complaint on file.   History of Present Illness: Betty Huber is a 58 y.o. female ***     Activities of Daily Living:  Patient reports morning stiffness for *** {minute/hour:19697}.   Patient {ACTIONS;DENIES/REPORTS:21021675::"Denies"} nocturnal pain.  Difficulty dressing/grooming: {ACTIONS;DENIES/REPORTS:21021675::"Denies"} Difficulty climbing stairs: {ACTIONS;DENIES/REPORTS:21021675::"Denies"} Difficulty getting out of chair: {ACTIONS;DENIES/REPORTS:21021675::"Denies"} Difficulty using hands for taps, buttons, cutlery, and/or writing: {ACTIONS;DENIES/REPORTS:21021675::"Denies"}  No Rheumatology ROS completed.   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   Paresthesia of right arm and leg 01/31/2014   Family history of hyperlipidemia 01/31/2014    Past Medical History:  Diagnosis Date   Allergy    Anemia    Arm laceration    3rd grade   Breast cancer (HCC) 2017   DCIS    Family History  Problem Relation Age of Onset   Hypertension Mother 71   Cancer Mother 46       uterine cancer & lung cancer   Hyperlipidemia Mother        Deceased   COPD Mother    Hearing loss Mother    Hypertension Father        16s   Cancer Father        kidney   Hyperlipidemia Father        Deceased   Arthritis Father    Asthma Father    Hearing loss Father    Diabetes Maternal Grandmother    ADD / ADHD Maternal Grandmother    Emphysema Maternal Grandfather    COPD Maternal Grandfather    Heart  disease Maternal Grandfather    Heart attack Maternal Grandfather    Heart attack Paternal Grandmother    Heart attack Paternal Grandfather    Colon cancer Neg Hx    Esophageal cancer Neg Hx    Rectal cancer Neg Hx    Stomach cancer Neg Hx    Past Surgical History:  Procedure Laterality Date   BREAST BIOPSY  10/2019   BREAST LUMPECTOMY     DILATATION & CURETTAGE/HYSTEROSCOPY WITH TRUECLEAR N/A 12/11/2013   Procedure: DILATATION & CURETTAGE/HYSTEROSCOPY WITH TRUCLEAR;  Surgeon: Bennye Alm, MD;  Location: [facility];  Service: Gynecology;  Laterality: N/A;   WISDOM TOOTH EXTRACTION  2008   Social History   Social History Narrative   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   She lives alone, no children.   Highest level of education:  B.S.   Immunization History  Administered Date(s) Administered   Influenza-Unspecified 04/22/2020, 03/02/2022, 03/02/2023   PFIZER(Purple Top)SARS-COV-2 Vaccination 08/24/2019, 09/14/2019, 04/22/2020, 03/04/2021, 03/02/2022   Pfizer Covid-19 Vaccine Bivalent Booster 32yrs & up 03/02/2023   Tdap 11/21/2007, 12/16/2017   Zoster Recombinant(Shingrix) 02/18/2021, 09/01/2021     Objective: Vital Signs: LMP  (LMP Unknown) Comment: last menstrual cycle at age 89   Physical Exam   Musculoskeletal Exam: ***  CDAI Exam: CDAI Score: -- Patient  Global: --; Provider Global: -- Swollen: --; Tender: -- Joint Exam 06/14/2023   No joint exam has been documented for this visit   There is currently no information documented on the homunculus. Go to the Rheumatology activity and complete the homunculus joint exam.  Investigation: No additional findings.  Imaging: No results found.  Recent Labs: Lab Results  Component Value Date   WBC 4.8 03/26/2022   HGB 14.2 03/26/2022   PLT 293.0 03/26/2022   NA 143 03/29/2023   K 4.3 03/29/2023   CL 103 03/29/2023   CO2 31 03/29/2023   GLUCOSE 89 03/29/2023   BUN 10 03/29/2023   CREATININE  0.66 03/29/2023   BILITOT 0.7 03/29/2023   ALKPHOS 68 03/29/2023   AST 19 03/29/2023   ALT 20 03/29/2023   PROT 7.4 03/29/2023   ALBUMIN 4.4 03/29/2023   CALCIUM 9.7 03/29/2023   GFRAA 119 12/16/2017   January 06, 2023 ANA 1: 80 NS, uric acid 4.0, RF negative, anti-CCP negative, sed rate 15, CRP<16, ACE 24, TSH normal, vitamin D42.2, ferritin  36.5  December 03, 2022 left wrist x-rays IMPRESSION: 1. No acute fracture or dislocation. 2. Periarticular osteopenia. This is nonspecific but can be seen in the earliest manifestation of inflammatory arthropathy. Recommend correlation with laboratory values. Electronically Signed   By: Meda Klinefelter M.D.   On: 12/08/2022 15:42   Speciality Comments: No specialty comments available.  Procedures:  No procedures performed Allergies: Patient has no known allergies.   Assessment / Plan:     Visit Diagnoses: No diagnosis found.  Orders: No orders of the defined types were placed in this encounter.  No orders of the defined types were placed in this encounter.   Face-to-face time spent with patient was *** minutes. Greater than 50% of time was spent in counseling and coordination of care.  Follow-Up Instructions: No follow-ups on file.   Pollyann Savoy, MD  Note - This record has been created using Animal nutritionist.  Chart creation errors have been sought, but may not always  have been located. Such creation errors do not reflect on  the standard of medical care.

## 2023-06-07 NOTE — Progress Notes (Signed)
 Office Visit Note  Patient: Betty Huber             Date of Birth: Jan 02, 1966           MRN: [id number]             PCP: Betty Organ, NP Referring: Betty Gander, DO Visit Date: 06/09/2023 Occupation: @GUAROCC @  Subjective:  Positive ANA  History of Present Illness: Betty Huber is a 58 y.o. female referred by Dr. Cleora Huber for the evaluation of positive ANA.  Patient states in January of 2024 she was putting Christmas decorations and sprained her right chest muscle.  She started using her left hand more and developed pain and discomfort in her left thumb and left wrist.   Patient's symptoms started with left wrist pain in April 2024.Patient had ultrasound-guided left de Quervain's tendon sheath injection on January 06, 2023 by Dr. Cleora Huber.  Patient states that her left wrist was immobilized for couple of months prior to the injection.  She also had x-rays of her left wrist which showed juxta-articular osteopenia.  Dr. Cleora Huber  obtained labs which showed positive ANA.  She denies any history of oral ulcers, nasal ulcers, malar rash, photosensitivity, Raynaud's, lymphadenopathy or inflammatory arthritis.  She has had nasal staph infection couple of times which resolved with topical treatment.  She also gives history of muscle cramps and headaches at times.  Patient reports that she had menarche at age 48 and menopause at age 32.  She denies any history of long-term use of antacids, antiepileptic, or long-term prednisone  use.  She had steroid injection for the de Quervain's tenosynovitis and also had short course of prednisone  for the chest muscle pain early last year.  She does not drink any alcohol she is non-smoker there is no history of stress fracture.  She states she has been walking about 45 minutes to 1 hour daily since she was in her 45s.  She always stays active.  He is gravida 0.  She has been taking vitamin D  for the last few years.  She states her vitamin D  was borderline low in  the normal range.  Her mother had osteopenia.  She has a maternal aunt who has osteopenia.    Activities of Daily Living:  Patient reports morning stiffness for 0 minute.   Patient Denies nocturnal pain.  Difficulty dressing/grooming: Denies Difficulty climbing stairs: Denies Difficulty getting out of chair: Denies Difficulty using hands for taps, buttons, cutlery, and/or writing: Denies  Review of Systems  Constitutional:  Negative for fatigue.  HENT:  Positive for mouth dryness. Negative for mouth sores.   Eyes:  Negative for dryness.  Respiratory:  Negative for shortness of breath.   Cardiovascular:  Negative for chest pain and palpitations.  Gastrointestinal:  Negative for blood in stool, constipation and diarrhea.  Endocrine: Negative for increased urination.  Genitourinary:  Negative for involuntary urination.  Musculoskeletal:  Negative for joint pain, gait problem, joint pain, joint swelling, myalgias, muscle weakness, morning stiffness, muscle tenderness and myalgias.  Skin:  Positive for rash. Negative for color change, hair loss and sensitivity to sunlight.  Allergic/Immunologic: Negative for susceptible to infections.  Neurological:  Positive for headaches. Negative for dizziness.  Hematological:  Negative for swollen glands.  Psychiatric/Behavioral:  Positive for sleep disturbance. Negative for depressed mood. The patient is not nervous/anxious.     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   Paresthesia of right arm and leg 01/31/2014   Family history of hyperlipidemia 01/31/2014    Past Medical History:  Diagnosis Date   Allergy    Anemia    Arm laceration    3rd grade   Breast cancer (HCC) 2017   DCIS    Family History   Problem Relation Age of Onset   Hypertension Mother 40   Cancer Mother 56       uterine cancer & lung cancer   Hyperlipidemia Mother        Deceased   COPD Mother    Hearing loss Mother    Hypertension Father        3s   Cancer Father        kidney   Hyperlipidemia Father        Deceased   Arthritis Father    Asthma Father    Hearing loss Father    Diabetes Maternal Grandmother    ADD / ADHD Maternal Grandmother    Emphysema Maternal Grandfather    COPD Maternal Grandfather    Heart disease Maternal Grandfather    Heart attack Maternal Grandfather    Heart attack Paternal Grandmother    Heart attack Paternal Grandfather    Lupus Other    Colon cancer Neg Hx    Esophageal cancer Neg Hx    Rectal cancer Neg Hx    Stomach cancer Neg Hx    Past Surgical History:  Procedure Laterality Date   BREAST BIOPSY  10/2019   BREAST LUMPECTOMY     DILATATION & CURETTAGE/HYSTEROSCOPY WITH TRUECLEAR N/A 12/11/2013   Procedure: DILATATION & CURETTAGE/HYSTEROSCOPY WITH TRUCLEAR;  Surgeon: Betty Pontes, MD;  Location: [facility];  Service: Gynecology;  Laterality: N/A;   WISDOM TOOTH EXTRACTION  2008   Social History   Social History Narrative   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   She lives alone, no children.   Highest level of education:  B.S.   Immunization History  Administered Date(s) Administered   Influenza-Unspecified 04/22/2020, 03/02/2022, 03/02/2023   PFIZER(Purple Top)SARS-COV-2 Vaccination 08/24/2019, 09/14/2019, 04/22/2020, 03/04/2021, 03/02/2022   Pfizer Covid-19 Vaccine Bivalent Booster 56yrs & up 03/02/2023   Tdap 11/21/2007, 12/16/2017   Zoster Recombinant(Shingrix) 02/18/2021, 09/01/2021     Objective: Vital Signs: BP (!) 148/84 (BP Location: Right Arm, Patient Position: Sitting, Cuff Size: Normal)   Pulse (!) 108   Resp 14   Ht 5' 5.25" (1.657 m)   Wt 142 lb (64.4 kg)   LMP  (LMP Unknown) Comment: last menstrual cycle at age 71  BMI  23.45 kg/m    Physical Exam Vitals and nursing note reviewed.  Constitutional:      Appearance: She is well-developed.  HENT:     Head: Normocephalic and atraumatic.  Eyes:     Conjunctiva/sclera: Conjunctivae normal.  Cardiovascular:     Rate and Rhythm: Normal rate and regular rhythm.     Heart sounds: Normal heart sounds.  Pulmonary:     Effort: Pulmonary effort is normal.     Breath sounds: Normal breath sounds.  Abdominal:     General: Bowel sounds are normal.     Palpations: Abdomen is soft.  Musculoskeletal:     Cervical back: Normal range of motion.  Lymphadenopathy:     Cervical: No cervical adenopathy.  Skin:  General: Skin is warm and dry.     Capillary Refill: Capillary refill takes less than 2 seconds.  Neurological:     Mental Status: She is alert and oriented to person, place, and time.  Psychiatric:        Behavior: Behavior normal.      Musculoskeletal Exam: Cervical, thoracic and lumbar spine 1 good range of motion.  Shoulder joints, with joints, wrist joints, MCPs PIPs and DIPs been good range of motion.  Dermal atrophy was noted over the left thumb over the snuffbox.  DIP thickening was noted.  No synovitis was noted.  Hip joints and knee joints were in good range of motion without any warmth swelling or effusion.  There was no tenderness over ankles or MTPs.  Patient had no difficulty getting up from the sitting position.  CDAI Exam: CDAI Score: -- Patient Global: --; Provider Global: -- Swollen: --; Tender: -- Joint Exam 06/09/2023   No joint exam has been documented for this visit   There is currently no information documented on the homunculus. Go to the Rheumatology activity and complete the homunculus joint exam.  Investigation: No additional findings.  Imaging: No results found.  Recent Labs: Lab Results  Component Value Date   WBC 4.8 03/26/2022   HGB 14.2 03/26/2022   PLT 293.0 03/26/2022   NA 143 03/29/2023   K 4.3 03/29/2023    CL 103 03/29/2023   CO2 31 03/29/2023   GLUCOSE 89 03/29/2023   BUN 10 03/29/2023   CREATININE 0.66 03/29/2023   BILITOT 0.7 03/29/2023   ALKPHOS 68 03/29/2023   AST 19 03/29/2023   ALT 20 03/29/2023   PROT 7.4 03/29/2023   ALBUMIN 4.4 03/29/2023   CALCIUM 9.7 03/29/2023   GFRAA 119 12/16/2017   January 06, 2023 ANA 1: 80 NS, RF negative, anti-CCP negative, ACE 24, uric acid 4.0 ESR 15, CRP<1.0, TSH normal, vitamin D  42.20  Speciality Comments: No specialty comments available.  Procedures:  No procedures performed Allergies: Patient has no known allergies.   Assessment / Plan:     Visit Diagnoses: Positive ANA (antinuclear antibody) -patient has low titer positive ANA.  She gives history of nasal ulcer which was positive for staph infection and resolved after topical agents.  She denies any history of oral ulcers, sicca symptoms, malar rash, photosensitivity, Raynaud's or inflammatory arthritis.  There is no history of lymphadenopathy.  No synovitis was noted on the examination.  Plan: Anti-scleroderma antibody, RNP Antibody, Anti-Smith antibody, Sjogrens syndrome-A extractable nuclear antibody, Sjogrens syndrome-B extractable nuclear antibody, Anti-DNA antibody, double-stranded, C3 and C4  Pain in both hand -she has some stiffness in her hands.  Bilateral DIP thickening was noted.  No synovitis was noted.  Previous x-ray of her left wrist joint showed osteopenia.  Patient states that she was in a brace for 2 months prior to the x-ray for de Quervain's tenosynovitis.  I will obtain x-rays for comparison today.  Plan: XR Hand 2 View Left, XR Hand 2 view Right.  X-rays showed findings consistent with osteoarthritis.  Juxta-articular osteopenia was noted.  I will be obtaining DEXA scan to evaluate for generalized osteopenia.  De Quervain's tenosynovitis, left-resolved after cortisone injection.  She was asymptomatic today.  She has developed mild dermal atrophy from the cortisone injection  M.  Osteoporosis screening -this history of osteopenia in her mother and maternal aunt.  There is no personal or family history of fracture.  Patient had injury to her left arm when she  was a child requiring stitches.  She does not recall immobilization at that point.  She states she had vitamin D  in the lower normal range in the past.  She has been taking vitamin D  on a regular basis.  She had menarche at age 58 and menopause at age 53.  She had been walking 45 minutes to 1 hour daily since she was in her 51s.  She does not do any resistive exercises.  At this no history of smoking or alcohol intake.  She denies any history of taking antacids, thyroid  medications or antiepileptics.  She has taken steroids occasionally if in the past.  I will schedule DEXA scan and will obtain following labs today.  Plan: Parathyroid  hormone, intact (no Ca), Phosphorus, Tissue transglutaminase, IgA, Gliadin antibodies, serum, Serum protein electrophoresis with reflex  Postmenopausal  Ductal carcinoma in situ of left breast - 2017, lumpectomy and RTX , no CTX  Seasonal allergies  Family history of hyperlipidemia  Orders: Orders Placed This Encounter  Procedures   XR Hand 2 View Left   Anti-scleroderma antibody   RNP Antibody   Anti-Smith antibody   Sjogrens syndrome-A extractable nuclear antibody   Sjogrens syndrome-B extractable nuclear antibody   Anti-DNA antibody, double-stranded   C3 and C4   Parathyroid  hormone, intact (no Ca)   Phosphorus   Tissue transglutaminase, IgA   Gliadin antibodies, serum   Serum protein electrophoresis with reflex   No orders of the defined types were placed in this encounter.   Face-to-face time spent with patient was 45 minutes. Greater than 50% of time was spent in counseling and coordination of care.  Follow-Up Instructions: Return for Osteopenia, positive ANA.   Nicholas Bari, MD  Note - This record has been created using Animal nutritionist.  Chart creation  errors have been sought, but may not always  have been located. Such creation errors do not reflect on  the standard of medical care.

## 2023-06-09 ENCOUNTER — Ambulatory Visit (INDEPENDENT_AMBULATORY_CARE_PROVIDER_SITE_OTHER): Payer: 59

## 2023-06-09 ENCOUNTER — Telehealth: Payer: Self-pay | Admitting: *Deleted

## 2023-06-09 ENCOUNTER — Ambulatory Visit: Payer: 59 | Attending: Rheumatology | Admitting: Rheumatology

## 2023-06-09 ENCOUNTER — Ambulatory Visit: Payer: 59

## 2023-06-09 ENCOUNTER — Encounter: Payer: Self-pay | Admitting: Rheumatology

## 2023-06-09 VITALS — BP 148/84 | HR 108 | Resp 14 | Ht 65.25 in | Wt 142.0 lb

## 2023-06-09 DIAGNOSIS — Z83438 Family history of other disorder of lipoprotein metabolism and other lipidemia: Secondary | ICD-10-CM

## 2023-06-09 DIAGNOSIS — J302 Other seasonal allergic rhinitis: Secondary | ICD-10-CM

## 2023-06-09 DIAGNOSIS — R768 Other specified abnormal immunological findings in serum: Secondary | ICD-10-CM

## 2023-06-09 DIAGNOSIS — M79641 Pain in right hand: Secondary | ICD-10-CM

## 2023-06-09 DIAGNOSIS — Z13820 Encounter for screening for osteoporosis: Secondary | ICD-10-CM

## 2023-06-09 DIAGNOSIS — M79642 Pain in left hand: Secondary | ICD-10-CM

## 2023-06-09 DIAGNOSIS — Z780 Asymptomatic menopausal state: Secondary | ICD-10-CM

## 2023-06-09 DIAGNOSIS — M654 Radial styloid tenosynovitis [de Quervain]: Secondary | ICD-10-CM | POA: Diagnosis not present

## 2023-06-09 DIAGNOSIS — R202 Paresthesia of skin: Secondary | ICD-10-CM

## 2023-06-09 DIAGNOSIS — D0512 Intraductal carcinoma in situ of left breast: Secondary | ICD-10-CM

## 2023-06-09 DIAGNOSIS — M25532 Pain in left wrist: Secondary | ICD-10-CM

## 2023-06-09 NOTE — Telephone Encounter (Signed)
 Order for DEXA scan placed per Dr. Alvira Josephs

## 2023-06-11 NOTE — Progress Notes (Signed)
Celiac panel negative, phosphorus normal, PTH normal, C3-C4 normal, SCL 70 negative, RNP negative, Smith negative, SSA negative, SSB negative, dsDNA negative.  Will discuss results at the follow-up visit.

## 2023-06-14 ENCOUNTER — Encounter: Payer: BC Managed Care – PPO | Admitting: Rheumatology

## 2023-06-14 DIAGNOSIS — D0512 Intraductal carcinoma in situ of left breast: Secondary | ICD-10-CM

## 2023-06-14 DIAGNOSIS — M8589 Other specified disorders of bone density and structure, multiple sites: Secondary | ICD-10-CM

## 2023-06-14 DIAGNOSIS — R768 Other specified abnormal immunological findings in serum: Secondary | ICD-10-CM

## 2023-06-14 DIAGNOSIS — M25532 Pain in left wrist: Secondary | ICD-10-CM

## 2023-06-14 DIAGNOSIS — R202 Paresthesia of skin: Secondary | ICD-10-CM

## 2023-06-14 DIAGNOSIS — M654 Radial styloid tenosynovitis [de Quervain]: Secondary | ICD-10-CM

## 2023-06-14 DIAGNOSIS — Z83438 Family history of other disorder of lipoprotein metabolism and other lipidemia: Secondary | ICD-10-CM

## 2023-06-15 LAB — PARATHYROID HORMONE, INTACT (NO CA): PTH: 42 pg/mL (ref 16–77)

## 2023-06-15 LAB — PROTEIN ELECTROPHORESIS, SERUM, WITH REFLEX
Albumin ELP: 4.4 g/dL (ref 3.8–4.8)
Alpha 1: 0.3 g/dL (ref 0.2–0.3)
Alpha 2: 0.6 g/dL (ref 0.5–0.9)
Beta 2: 0.6 g/dL — ABNORMAL HIGH (ref 0.2–0.5)
Beta Globulin: 0.5 g/dL (ref 0.4–0.6)
Gamma Globulin: 1.1 g/dL (ref 0.8–1.7)
Total Protein: 7.4 g/dL (ref 6.1–8.1)

## 2023-06-15 LAB — ANTI-SCLERODERMA ANTIBODY: Scleroderma (Scl-70) (ENA) Antibody, IgG: <1 AI

## 2023-06-15 LAB — C3 AND C4
C3 Complement: 130 mg/dL (ref 83–193)
C4 Complement: 27 mg/dL (ref 15–57)

## 2023-06-15 LAB — RNP ANTIBODY: Ribonucleic Protein(ENA) Antibody, IgG: <1 AI

## 2023-06-15 LAB — ANTI-DNA ANTIBODY, DOUBLE-STRANDED: ds DNA Ab: <1 [IU]/mL

## 2023-06-15 LAB — IFE INTERPRETATION

## 2023-06-15 LAB — SJOGRENS SYNDROME-A EXTRACTABLE NUCLEAR ANTIBODY: SSA (Ro) (ENA) Antibody, IgG: <1 AI

## 2023-06-15 LAB — GLIADIN ANTIBODIES, SERUM
Gliadin IgA: <1 U/mL
Gliadin IgG: <1 U/mL

## 2023-06-15 LAB — PHOSPHORUS: Phosphorus: 3.6 mg/dL (ref 2.5–4.5)

## 2023-06-15 LAB — ANTI-SMITH ANTIBODY: ENA SM Ab Ser-aCnc: <1 AI

## 2023-06-15 LAB — TISSUE TRANSGLUTAMINASE, IGA: (tTG) Ab, IgA: <1 U/mL

## 2023-06-15 LAB — SJOGRENS SYNDROME-B EXTRACTABLE NUCLEAR ANTIBODY: SSB (La) (ENA) Antibody, IgG: <1 AI

## 2023-06-28 ENCOUNTER — Ambulatory Visit (HOSPITAL_BASED_OUTPATIENT_CLINIC_OR_DEPARTMENT_OTHER)
Admission: RE | Admit: 2023-06-28 | Discharge: 2023-06-28 | Disposition: A | Discharge status: Home / Self Care | Payer: 59 | Source: Ambulatory Visit | Attending: Rheumatology | Admitting: Rheumatology

## 2023-06-28 DIAGNOSIS — Z780 Asymptomatic menopausal state: Secondary | ICD-10-CM | POA: Insufficient documentation

## 2023-06-28 DIAGNOSIS — Z13820 Encounter for screening for osteoporosis: Secondary | ICD-10-CM | POA: Diagnosis present

## 2023-06-28 NOTE — Progress Notes (Signed)
DEXA scan is suggestive of osteoporosis.  Will discuss treatment options at the follow-up visit.

## 2023-07-01 NOTE — Progress Notes (Deleted)
 Office Visit Note  Patient: Betty Huber             Date of Birth: 10-13-1965           MRN: [id number]             PCP: Anne Ng, NP Referring: Anne Ng, NP Visit Date: 07/15/2023 Occupation: @GUAROCC @  Subjective:  No chief complaint on file.   History of Present Illness: Sherley Mckenney is a 58 y.o. female ***     Activities of Daily Living:  Patient reports morning stiffness for *** {minute/hour:19697}.   Patient {ACTIONS;DENIES/REPORTS:21021675::"Denies"} nocturnal pain.  Difficulty dressing/grooming: {ACTIONS;DENIES/REPORTS:21021675::"Denies"} Difficulty climbing stairs: {ACTIONS;DENIES/REPORTS:21021675::"Denies"} Difficulty getting out of chair: {ACTIONS;DENIES/REPORTS:21021675::"Denies"} Difficulty using hands for taps, buttons, cutlery, and/or writing: {ACTIONS;DENIES/REPORTS:21021675::"Denies"}  No Rheumatology ROS completed.   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   Paresthesia of right arm and leg 01/31/2014   Family history of hyperlipidemia 01/31/2014    Past Medical History:  Diagnosis Date   Allergy    Anemia    Arm laceration    3rd grade   Breast cancer (HCC) 2017   DCIS    Family History  Problem Relation Age of Onset   Hypertension Mother 6   Cancer Mother 12       uterine cancer & lung cancer   Hyperlipidemia Mother        Deceased   COPD Mother    Hearing loss Mother    Hypertension Father        2s   Cancer Father        kidney   Hyperlipidemia Father        Deceased   Arthritis Father    Asthma Father    Hearing loss Father    Diabetes Maternal Grandmother    ADD / ADHD Maternal Grandmother    Emphysema Maternal Grandfather    COPD Maternal Grandfather    Heart  disease Maternal Grandfather    Heart attack Maternal Grandfather    Heart attack Paternal Grandmother    Heart attack Paternal Grandfather    Lupus Other    Colon cancer Neg Hx    Esophageal cancer Neg Hx    Rectal cancer Neg Hx    Stomach cancer Neg Hx    Past Surgical History:  Procedure Laterality Date   BREAST BIOPSY  10/2019   BREAST LUMPECTOMY     DILATATION & CURETTAGE/HYSTEROSCOPY WITH TRUECLEAR N/A 12/11/2013   Procedure: DILATATION & CURETTAGE/HYSTEROSCOPY WITH TRUCLEAR;  Surgeon: Bennye Alm, MD;  Location: [facility];  Service: Gynecology;  Laterality: N/A;   WISDOM TOOTH EXTRACTION  2008   Social History   Social History Narrative   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   She lives alone, no children.   Highest level of education:  B.S.   Immunization History  Administered Date(s) Administered   Influenza-Unspecified 04/22/2020, 03/02/2022, 03/02/2023   PFIZER(Purple Top)SARS-COV-2 Vaccination 08/24/2019, 09/14/2019, 04/22/2020, 03/04/2021, 03/02/2022   Pfizer Covid-19 Vaccine Bivalent Booster 38yrs & up 03/02/2023   Tdap 11/21/2007, 12/16/2017   Zoster Recombinant(Shingrix) 02/18/2021, 09/01/2021     Objective: Vital Signs: LMP  (LMP Unknown) Comment: last menstrual cycle at age 58   Physical Exam   Musculoskeletal Exam: ***  CDAI Exam: CDAI Score: -- Patient Global: --; Provider Global: -- Swollen: --; Tender: -- Joint Exam 07/15/2023   No joint exam has been documented for this visit   There is currently no information documented on the homunculus. Go to the Rheumatology activity and complete the homunculus joint exam.  Investigation: No additional findings.  Imaging: DG BONE DENSITY (DXA) Result Date: 06/28/2023 EXAM: DUAL X-RAY ABSORPTIOMETRY (DXA) FOR BONE MINERAL DENSITY IMPRESSION: Referring Physician:  Pollyann Savoy Your patient completed a bone mineral density test using GE Lunar iDXA system (analysis version: 16).  Technologist: CAM PATIENT: Name: Emira, Eubanks Patient ID: [id number] Birth Date: 1966/04/25 Height: 65.2 in. Sex: Female Measured: 06/28/2023 Weight: 142.0 lbs. Indications: Breast Cancer History, Caucasian Fractures: Treatments: Vitamin D ASSESSMENT: The BMD measured at DualFemur Neck Right is 0.670 g/cm2 with a T-score of -2.6. This patient is considered osteoporotic according to World Health Organization [facility]) criteria. Scan quality is good. Exclusions: None Site Region Measured Date Measured Age YA BMD Significant CHANGE T-score DualFemur Neck Right 06[age]28/2023    56.6         -2.6    0.670 g/cm2 AP Spine  L1-L4      7/28/2023    56.6         -1.9    0.965 g/cm2 DualFemur Total Mean 06/28/2023    56.6         -2.2    0.731 g/cm2 World Health Organization [facility]) criteria for post-menopausal, Caucasian Women: Normal       T-score at or above -1 SD Osteopenia   T-score between -1 and -2.5 SD Osteoporosis T-score at or below -2.5 SD RECOMMENDATION: 1. All patients should optimize calcium and vitamin D intake. 2. Consider FDA approved medical therapies in postmenopausal women and men aged 86 years and older, based on the following: a. A hip or vertebral (clinical or morphometric) fracture b. T-score = -2.5 at the femoral neck or spine after appropriate evaluation to exclude secondary causes c. Low bone mass (T-score between -1.0 and -2.5 at the femoral neck or spine) and a 10- year probability of a hip fracture = 3% or a 10 year probability of a major osteoporosis-related fracture = 20% based on the [place]-adapted WHO algorithm. 3. Clinician judgement and/or patient preference may indicate treatment for people with 10-year fracture probabilities above or below these levels. FOLLOW-UP: Patients with diagnosis of osteoporosis or at high risk for fracture should have regular bone mineral density tests. For patients eligible for Medicare routine testing is allowed once every 2 years. The testing frequency can be increased to  one year for patients who have rapidly progressing disease, those who are receiving or discontinuing medical therapy to restore bone mass, or have additional risk factors. (Sample) People with diagnosed cases of osteoporosis or at high risk for fracture should have regular bone mineral density tests. For patients eligible for Medicare, routine testing is allowed once every 2 years. The testing frequency can be increased to one year for patients who have rapidly progressing disease, those who are receiving or discontinuing medical therapy to restore bone mass, or have additional risk factors. I have reviewed this study and agree with the findings. [facility] Electronically Signed   By: Frederico Hamman M.D.   On: 06/28/2023 11:36   XR Hand 2 View Left Result Date: 06/09/2023 Juxta-articular osteopenia was noted.  CMC, PIP and DIP narrowing was noted.  No MCP, intercarpal or radiocarpal joint space narrowing was noted.  No erosive changes  were noted. 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  She juxta-articular osteopenia was noted.  XR Hand 2 View Right Result Date: 06/09/2023 Juxta-articular osteopenia was noted.  CMC, PIP and DIP narrowing was noted.  No MCP, intercarpal or radiocarpal joint space narrowing was noted.  No erosive changes were noted. 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  She juxta-articular osteopenia was noted.   Recent Labs: Lab Results  Component Value Date   WBC 4.8 03/26/2022   HGB 14.2 03/26/2022   PLT 293.0 03/26/2022   NA 143 03/29/2023   K 4.3 03/29/2023   CL 103 03/29/2023   CO2 31 03/29/2023   GLUCOSE 89 03/29/2023   BUN 10 03/29/2023   CREATININE 0.66 03/29/2023   BILITOT 0.7 03/29/2023   ALKPHOS 68 03/29/2023   AST 19 03/29/2023   ALT 20 03/29/2023   PROT 7.4 06/09/2023   ALBUMIN 4.4 03/29/2023   CALCIUM 9.7 03/29/2023   GFRAA 119 12/16/2017   02/07/2024 IFE normal, C3-C4  normal, ENA panel (SCL 70, RNP, Smith, SSA, SSB, dsDNA) negative, PTH normal, phosphorus normal, antigliadin antibody negative, anti-tTG antibody negative  Speciality Comments: No specialty comments available.  Procedures:  No procedures performed Allergies: Patient has no known allergies.   Assessment / Plan:     Visit Diagnoses: Positive ANA (antinuclear antibody) - ANA low titer positive, ENA negative, complements normal, history of nasal ulcers positive for staph infection.  No other clinical features of autoimmune di  Pain in both hands - Clinical and graphic findings suggestive of osteoarthritis.  De Quervain's tenosynovitis, left - Resolved after cortisone injection.  She developed mild dermal atrophy after cortisone injection.  Age-related osteoporosis without current pathological fracture - June 28, 2023 DEXA scan:T-score DualFemur Neck Right 06/28/2023   T-score    -2.6 BMD, 0.670 g/cm2  Ductal carcinoma in situ of left breast - 2017, status postlumpectomy and radiation therapy.  No chemotherapy.  Seasonal allergies  Family history of hyperlipidemia  Orders: No orders of the defined types were placed in this encounter.  No orders of the defined types were placed in this encounter.   Face-to-face time spent with patient was *** minutes. Greater than 50% of time was spent in counseling and coordination of care.  Follow-Up Instructions: No follow-ups on file.   Pollyann Savoy, MD  Note - This record has been created using Animal nutritionist.  Chart creation errors have been sought, but may not always  have been located. Such creation errors do not reflect on  the standard of medical care.

## 2023-07-15 ENCOUNTER — Ambulatory Visit: Payer: 59 | Admitting: Rheumatology

## 2023-07-15 DIAGNOSIS — J302 Other seasonal allergic rhinitis: Secondary | ICD-10-CM

## 2023-07-15 DIAGNOSIS — M79641 Pain in right hand: Secondary | ICD-10-CM

## 2023-07-15 DIAGNOSIS — M654 Radial styloid tenosynovitis [de Quervain]: Secondary | ICD-10-CM

## 2023-07-15 DIAGNOSIS — Z83438 Family history of other disorder of lipoprotein metabolism and other lipidemia: Secondary | ICD-10-CM

## 2023-07-15 DIAGNOSIS — D0512 Intraductal carcinoma in situ of left breast: Secondary | ICD-10-CM

## 2023-07-15 DIAGNOSIS — M810 Age-related osteoporosis without current pathological fracture: Secondary | ICD-10-CM

## 2023-07-15 DIAGNOSIS — R768 Other specified abnormal immunological findings in serum: Secondary | ICD-10-CM

## 2023-07-25 NOTE — Progress Notes (Unsigned)
 Office Visit Note  Patient: Betty Huber             Date of Birth: 29-Apr-1966           MRN: [id number]             PCP: Anne Ng, NP Referring: Anne Ng, NP Visit Date: 07/26/2023 Occupation: @GUAROCC @  Subjective:  No chief complaint on file.   History of Present Illness: Betty Huber is a 58 y.o. female ***     Activities of Daily Living:  Patient reports morning stiffness for *** {minute/hour:19697}.   Patient {ACTIONS;DENIES/REPORTS:21021675::"Denies"} nocturnal pain.  Difficulty dressing/grooming: {ACTIONS;DENIES/REPORTS:21021675::"Denies"} Difficulty climbing stairs: {ACTIONS;DENIES/REPORTS:21021675::"Denies"} Difficulty getting out of chair: {ACTIONS;DENIES/REPORTS:21021675::"Denies"} Difficulty using hands for taps, buttons, cutlery, and/or writing: {ACTIONS;DENIES/REPORTS:21021675::"Denies"}  No Rheumatology ROS completed.   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   Paresthesia of right arm and leg 01/31/2014   Family history of hyperlipidemia 01/31/2014    Past Medical History:  Diagnosis Date   Allergy    Anemia    Arm laceration    3rd grade   Breast cancer (HCC) 2017   DCIS    Family History  Problem Relation Age of Onset   Hypertension Mother 43   Cancer Mother 18       uterine cancer & lung cancer   Hyperlipidemia Mother        Deceased   COPD Mother    Hearing loss Mother    Hypertension Father        70s   Cancer Father        kidney   Hyperlipidemia Father        Deceased   Arthritis Father    Asthma Father    Hearing loss Father    Diabetes Maternal Grandmother    ADD / ADHD Maternal Grandmother    Emphysema Maternal Grandfather    COPD Maternal Grandfather    Heart  disease Maternal Grandfather    Heart attack Maternal Grandfather    Heart attack Paternal Grandmother    Heart attack Paternal Grandfather    Lupus Other    Colon cancer Neg Hx    Esophageal cancer Neg Hx    Rectal cancer Neg Hx    Stomach cancer Neg Hx    Past Surgical History:  Procedure Laterality Date   BREAST BIOPSY  10/2019   BREAST LUMPECTOMY     DILATATION & CURETTAGE/HYSTEROSCOPY WITH TRUECLEAR N/A 12/11/2013   Procedure: DILATATION & CURETTAGE/HYSTEROSCOPY WITH TRUCLEAR;  Surgeon: Bennye Alm, MD;  Location: [facility];  Service: Gynecology;  Laterality: N/A;   WISDOM TOOTH EXTRACTION  2008   Social History   Social History Narrative   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   She lives alone, no children.   Highest level of education:  B.S.   Immunization History  Administered Date(s) Administered   Influenza-Unspecified 04/22/2020, 03/02/2022, 03/02/2023   PFIZER(Purple Top)SARS-COV-2 Vaccination 08/24/2019, 09/14/2019, 04/22/2020, 03/04/2021, 03/02/2022   Pfizer Covid-19 Vaccine Bivalent Booster 66yrs & up 03/02/2023   Tdap 11/21/2007, 12/16/2017   Zoster Recombinant(Shingrix) 02/18/2021, 09/01/2021     Objective: Vital Signs: LMP  (LMP Unknown) Comment: last menstrual cycle at age 28   Physical Exam   Musculoskeletal Exam: ***  CDAI Exam: CDAI Score: -- Patient Global: --; Provider Global: -- Swollen: --; Tender: -- Joint Exam 07/26/2023   No joint exam has been documented for this visit   There is currently no information documented on the homunculus. Go to the Rheumatology activity and complete the homunculus joint exam.  Investigation: No additional findings.  Imaging: DG BONE DENSITY (DXA) Result Date: 06/28/2023 EXAM: DUAL X-RAY ABSORPTIOMETRY (DXA) FOR BONE MINERAL DENSITY IMPRESSION: Referring Physician:  Pollyann Savoy Your patient completed a bone mineral density test using GE Lunar iDXA system (analysis version: 16).  Technologist: CAM PATIENT: Name: Betty, Huber Patient ID: [id number] Birth Date: April 21, 1966 Height: 65.2 in. Sex: Female Measured: 06/28/2023 Weight: 142.0 lbs. Indications: Breast Cancer History, Caucasian Fractures: Treatments: Vitamin D ASSESSMENT: The BMD measured at DualFemur Neck Right is 0.670 g/cm2 with a T-score of -2.6. This patient is considered osteoporotic according to World Health Organization [facility]) criteria. Scan quality is good. Exclusions: None Site Region Measured Date Measured Age YA BMD Significant CHANGE T-score DualFemur Neck Right 7/28/2024    56.6         -2.6    0.670 g/cm2 AP Spine  L1-L4      7/28/2023    56.6         -1.9    0.965 g/cm2 DualFemur Total Mean 06[age]28/2023    56.6         -2.2    0.731 g/cm2 World Health Organization [facility]) criteria for post-menopausal, Caucasian Women: Normal       T-score at or above -1 SD Osteopenia   T-score between -1 and -2.5 SD Osteoporosis T-score at or below -2.5 SD RECOMMENDATION: 1. All patients should optimize calcium and vitamin D intake. 2. Consider FDA approved medical therapies in postmenopausal women and men aged 55 years and older, based on the following: a. A hip or vertebral (clinical or morphometric) fracture b. T-score = -2.5 at the femoral neck or spine after appropriate evaluation to exclude secondary causes c. Low bone mass (T-score between -1.0 and -2.5 at the femoral neck or spine) and a 10- year probability of a hip fracture = 3% or a 10 year probability of a major osteoporosis-related fracture = 20% based on the [place]-adapted WHO algorithm. 3. Clinician judgement and/or patient preference may indicate treatment for people with 10-year fracture probabilities above or below these levels. FOLLOW-UP: Patients with diagnosis of osteoporosis or at high risk for fracture should have regular bone mineral density tests. For patients eligible for Medicare routine testing is allowed once every 2 years. The testing frequency can be increased to  one year for patients who have rapidly progressing disease, those who are receiving or discontinuing medical therapy to restore bone mass, or have additional risk factors. (Sample) People with diagnosed cases of osteoporosis or at high risk for fracture should have regular bone mineral density tests. For patients eligible for Medicare, routine testing is allowed once every 2 years. The testing frequency can be increased to one year for patients who have rapidly progressing disease, those who are receiving or discontinuing medical therapy to restore bone mass, or have additional risk factors. I have reviewed this study and agree with the findings. [facility] Electronically Signed   By: Frederico Hamman M.D.   On: 06/28/2023 11:36    Recent Labs: Lab Results  Component Value Date   WBC 4.8 03/26/2022   HGB 14.2 03/26/2022   PLT 293.0 03/26/2022   NA 143 03/29/2023   K 4.3 03/29/2023  CL 103 03/29/2023   CO2 31 03/29/2023   GLUCOSE 89 03/29/2023   BUN 10 03/29/2023   CREATININE 0.66 03/29/2023   BILITOT 0.7 03/29/2023   ALKPHOS 68 03/29/2023   AST 19 03/29/2023   ALT 20 03/29/2023   PROT 7.4 06/09/2023   ALBUMIN 4.4 03/29/2023   CALCIUM 9.7 03/29/2023   GFRAA 119 12/16/2017   June 09, 2023 IFE negative, ENA (double-stranded DNA, SSA, SSB, Smith, RNP, SCL 70) negative, C3-C4 normal, antigliadin negative, anti-TTG negative, PTH normal, phosphorus normal,  DEXA scan June 28, 2023 : The BMD measured at DualFemur Neck Right is 0.670 g/cm2 with a T-score of -2.6.   Speciality Comments: No specialty comments available.  Procedures:  No procedures performed Allergies: Patient has no known allergies.   Assessment / Plan:     Visit Diagnoses: Positive ANA (antinuclear antibody) - ANA was not repeated.  ENA panel negative.  Complements normal.  Pain in both hands - Clinical and radiographic findings were suggestive of osteoarthritis.  De Quervain's tenosynovitis,  left - Resolved after the cortisone injection and bracing.  Age-related osteoporosis without current pathological fracture - DEXA scan June 28, 2023 : The BMD measured at DualFemur Neck Right is 0.670 g/cm2 with a T-score of -2.6.  Ductal carcinoma in situ of left breast - 2017, lumpectomy and RTX , no CTX  Seasonal allergies  Family history of hyperlipidemia  Orders: No orders of the defined types were placed in this encounter.  No orders of the defined types were placed in this encounter.   Face-to-face time spent with patient was *** minutes. Greater than 50% of time was spent in counseling and coordination of care.  Follow-Up Instructions: No follow-ups on file.   Pollyann Savoy, MD  Note - This record has been created using Animal nutritionist.  Chart creation errors have been sought, but may not always  have been located. Such creation errors do not reflect on  the standard of medical care.

## 2023-07-26 ENCOUNTER — Ambulatory Visit: Payer: 59 | Attending: Rheumatology | Admitting: Rheumatology

## 2023-07-26 ENCOUNTER — Encounter: Payer: Self-pay | Admitting: Rheumatology

## 2023-07-26 VITALS — BP 150/85 | HR 110 | Resp 13 | Ht 65.25 in | Wt 141.8 lb

## 2023-07-26 DIAGNOSIS — M654 Radial styloid tenosynovitis [de Quervain]: Secondary | ICD-10-CM | POA: Diagnosis not present

## 2023-07-26 DIAGNOSIS — Z83438 Family history of other disorder of lipoprotein metabolism and other lipidemia: Secondary | ICD-10-CM

## 2023-07-26 DIAGNOSIS — R768 Other specified abnormal immunological findings in serum: Secondary | ICD-10-CM | POA: Diagnosis not present

## 2023-07-26 DIAGNOSIS — M810 Age-related osteoporosis without current pathological fracture: Secondary | ICD-10-CM | POA: Diagnosis not present

## 2023-07-26 DIAGNOSIS — M79641 Pain in right hand: Secondary | ICD-10-CM | POA: Diagnosis not present

## 2023-07-26 DIAGNOSIS — M79642 Pain in left hand: Secondary | ICD-10-CM

## 2023-07-26 DIAGNOSIS — J302 Other seasonal allergic rhinitis: Secondary | ICD-10-CM

## 2023-07-26 DIAGNOSIS — D0512 Intraductal carcinoma in situ of left breast: Secondary | ICD-10-CM

## 2023-07-26 NOTE — Patient Instructions (Addendum)
 Alendronate Tablets What is this medication? ALENDRONATE (a LEN droe nate) prevents and treats osteoporosis. It may also be used to treat Paget disease of the bone. It works by Interior and spatial designer stronger and less likely to break (fracture). It belongs to a group of medications called bisphosphonates. This medicine may be used for other purposes; ask your health care provider or pharmacist if you have questions. COMMON BRAND NAME(S): Fosamax What should I tell my care team before I take this medication? They need to know if you have any of these conditions: Bleeding disorder Cancer Dental disease Difficulty swallowing Infection (fever, chills, cough, sore throat, pain or trouble passing urine) Kidney disease Low levels of calcium or other minerals in the blood Low red blood cell counts Receiving steroids like dexamethasone or prednisone Stomach or intestine problems Trouble sitting or standing for 30 minutes An unusual or allergic reaction to alendronate, other medications, foods, dyes or preservatives Pregnant or trying to get pregnant Breast-feeding How should I use this medication? Take this medication by mouth with a full glass of water. Take it as directed on the prescription label at the same time every day. Take the dose right after waking up. Do not eat or drink anything before taking it. Do not take it with any other drink except water. Do not chew or crush the tablet. After taking it, do not eat breakfast, drink, or take any other medications or vitamins for at least 30 minutes. Sit or stand up for at least 30 minutes after you take it. Do not lie down. Keep taking it unless your care team tells you to stop. A special MedGuide will be given to you by the pharmacist with each prescription and refill. Be sure to read this information carefully each time. Talk to your care team about the use of this medication in children. Special care may be needed. Overdosage: If you think you have  taken too much of this medicine contact a poison control center or emergency room at once. NOTE: This medicine is only for you. Do not share this medicine with others. What if I miss a dose? If you take your medication once a day, skip it. Take your next dose at the scheduled time the next morning. Do not take two doses on the same day. If you take your medication once a week, take the missed dose on the morning after you remember. Do not take two doses on the same day. What may interact with this medication? Aluminum hydroxide Antacids Aspirin Calcium supplements Medications for inflammation like ibuprofen, naproxen, and others Iron supplements Magnesium supplements Vitamins with minerals This list may not describe all possible interactions. Give your health care provider a list of all the medicines, herbs, non-prescription drugs, or dietary supplements you use. Also tell them if you smoke, drink alcohol, or use illegal drugs. Some items may interact with your medicine. What should I watch for while using this medication? Visit your care team for regular checks on your progress. It may be some time before you see the benefit from this medication. Some people who take this medication have severe bone, joint, or muscle pain. This medication may also increase your risk for jaw problems or a broken thigh bone. Tell your care team right away if you have severe pain in your jaw, bones, joints, or muscles. Tell you care team if you have any pain that does not go away or that gets worse. Tell your dentist and dental surgeon that you are  taking this medication. You should not have major dental surgery while on this medication. See your dentist to have a dental exam and fix any dental problems before starting this medication. Take good care of your teeth while on this medication. Make sure you see your dentist for regular follow-up appointments. You should make sure you get enough calcium and vitamin D  while you are taking this medication. Discuss the foods you eat and the vitamins you take with your care team. You may need blood work done while you are taking this medication. What side effects may I notice from receiving this medication? Side effects that you should report to your care team as soon as possible: Allergic reactions--skin rash, itching, hives, swelling of the face, lips, tongue, or throat Low calcium level--muscle pain or cramps, confusion, tingling, or numbness in the hands or feet Osteonecrosis of the jaw--pain, swelling, or redness in the mouth, numbness of the jaw, poor healing after dental work, unusual discharge from the mouth, visible bones in the mouth Pain or trouble swallowing Severe bone, joint, or muscle pain Stomach bleeding--bloody or black, tar-like stools, vomiting blood or brown material that looks like coffee grounds Side effects that usually do not require medical attention (report to your care team if they continue or are bothersome): Constipation Diarrhea Nausea Stomach pain This list may not describe all possible side effects. Call your doctor for medical advice about side effects. You may report side effects to FDA at 1-800-FDA-1088. Where should I keep my medication? Keep out of the reach of children and pets. Store at room temperature between 15 and 30 degrees C (59 and 86 degrees F). Throw away any unused medication after the expiration date. NOTE: This sheet is a summary. It may not cover all possible information. If you have questions about this medicine, talk to your doctor, pharmacist, or health care provider.  2024 Elsevier/Gold Standard (2020-05-23 00:00:00)  Eating Plan for Osteoporosis Osteoporosis causes your bones to become weak and brittle. This puts you at greater risk for bone breaks (fractures) from small bumps or falls. Making changes to your diet and increasing your physical activity can help strengthen your bones and improve your  overall health. Calcium and vitamin D are nutrients that play an important role in bone health. Vitamin D helps your body use calcium and strengthen bones. It is important to get enough calcium and vitamin D as part of your eating plan for osteoporosis. What are tips for following this plan? Reading food labels Try to get at least 1,000 milligrams (mg) of calcium each day. Look for foods that have at least 50 mg of calcium per serving. Talk with your health care provider about taking a calcium supplement if you do not get enough calcium from food. Do not have more than 2,500 mg of calcium each day. This is the upper limit for food and nutritional supplements combined. Too much calcium may cause constipation and prevent you from absorbing other important nutrients. Choose foods that contain vitamin D. Take a daily vitamin supplement that contains 800-1,000 international units (IU) of vitamin D. The amount may be different depending on your age, body weight, and where you live. Talk with your dietitian or health care provider about how much vitamin D is right for you. Avoid foods that have more than 300 mg of sodium per serving. Too much sodium can cause your body to lose calcium. Talk with your dietitian or health care provider about how much sodium you are allowed  each day. Shopping Do not buy foods with added salt, including: Salted snacks. Rosita Fire. Canned soups. Canned meats. Processed meats, such as bacon or precooked or cured meat like sausages or meat loaves. Smoked fish. Meal planning Eat balanced meals that contain protein foods, fruits and vegetables, and foods rich in calcium and vitamin D. Eat at least 5 servings of fruits and vegetables each day. Eat 5-6 oz (142-170 g) of lean meat, poultry, fish, eggs, or beans each day. Lifestyle Do not use any products that contain nicotine or tobacco, such as cigarettes, e-cigarettes, and chewing tobacco. If you need help quitting, ask your  health care provider. If your health care provider recommends that you lose weight: Work with a dietitian to develop an eating plan that will help you reach your desired weight goal. Exercise for at least 30 minutes a day, 5 or more days a week, or as told by your health care provider. Work with a physical therapist to develop an exercise plan that includes flexibility, balance, and strength exercises. Do not focus only on aerobic exercise. Do not drink alcohol if: Your health care provider tells you not to drink. You are pregnant, may be pregnant, or are planning to become pregnant. If you drink alcohol: Limit how much you use to: 0-1 drink a day for women. 0-2 drinks a day for men. Be aware of how much alcohol is in your drink. In the [place] one drink equals one 12 oz bottle of beer (355 mL), one 5 oz glass of wine (148 mL), or one 1 oz glass of hard liquor (44 mL). What foods should I eat? Foods high in calcium  Yogurt. Yogurt with fruit. Milk. Evaporated skim milk. Dry milk powder. Calcium-fortified orange juice. Parmesan cheese. Part-skim ricotta cheese. Natural hard cheese. Cream cheese. Cottage cheese. Canned sardines. Canned salmon. Calcium-treated tofu. Calcium-fortified cereal bar. Calcium-fortified cereal. Calcium-fortified graham crackers. Cooked collard greens. Turnip greens. Broccoli. Kale. Almonds. White beans. Corn tortilla. Foods high in vitamin D Cod liver oil. Fatty fish, such as tuna, mackerel, and salmon. Milk. Fortified soy milk. Fortified fruit juice. Yogurt. Margarine. Egg yolks. Foods high in protein Beef. Lamb. Pork tenderloin. Chicken breast. Tuna (canned). Fish fillet. Tofu. Cooked soy beans. Soy patty. Beans (canned or cooked). Cottage cheese. Yogurt. Peanut butter. Pumpkin seeds. Nuts. Sunflower seeds. Hard cheese. Milk or other milk products, such as soy milk. The items listed above may not be a complete list of foods and beverages you can  eat. Contact a dietitian for more options. Summary Calcium and vitamin D are nutrients that play an important role in bone health and are an important part of your eating plan for osteoporosis. Eat balanced meals that contain protein foods, fruits and vegetables, and foods rich in calcium and vitamin D. Avoid foods that have more than 300 mg of sodium per serving. Too much sodium can cause your body to lose calcium. Exercise is an important part of prevention and treatment of osteoporosis. Aim for at least 30 minutes a day, 5 days a week. This information is not intended to replace advice given to you by your health care provider. Make sure you discuss any questions you have with your health care provider. Document Revised: 10/26/2019 Document Reviewed: 10/26/2019 Elsevier Patient Education  2024 Elsevier Inc.  Hand Exercises Hand exercises can be helpful for almost anyone. They can strengthen your hands and improve flexibility and movement. The exercises can also increase blood flow to the hands. These results can make your work  and daily tasks easier for you. Hand exercises can be especially helpful for people who have joint pain from arthritis or nerve damage from using their hands over and over. These exercises can also help people who injure a hand. Exercises Most of these hand exercises are gentle stretching and motion exercises. It is usually safe to do them often throughout the day. Warming up your hands before exercise may help reduce stiffness. You can do this with gentle massage or by placing your hands in warm water for 10-15 minutes. It is normal to feel some stretching, pulling, tightness, or mild discomfort when you begin new exercises. In time, this will improve. Remember to always be careful and stop right away if you feel sudden, very bad pain or your pain gets worse. You want to get better and be safe. Ask your health care provider which exercises are safe for you. Do exercises  exactly as told by your provider and adjust them as told. Do not begin these exercises until told by your provider. Knuckle bend or "claw" fist  Stand or sit with your arm, hand, and all five fingers pointed straight up. Make sure to keep your wrist straight. Gently bend your fingers down toward your palm until the tips of your fingers are touching your palm. Keep your big knuckle straight and only bend the small knuckles in your fingers. Hold this position for 10 seconds. Straighten your fingers back to your starting position. Repeat this exercise 5-10 times with each hand. Full finger fist  Stand or sit with your arm, hand, and all five fingers pointed straight up. Make sure to keep your wrist straight. Gently bend your fingers into your palm until the tips of your fingers are touching the middle of your palm. Hold this position for 10 seconds. Extend your fingers back to your starting position, stretching every joint fully. Repeat this exercise 5-10 times with each hand. Straight fist  Stand or sit with your arm, hand, and all five fingers pointed straight up. Make sure to keep your wrist straight. Gently bend your fingers at the big knuckle, where your fingers meet your hand, and at the middle knuckle. Keep the knuckle at the tips of your fingers straight and try to touch the bottom of your palm. Hold this position for 10 seconds. Extend your fingers back to your starting position, stretching every joint fully. Repeat this exercise 5-10 times with each hand. Tabletop  Stand or sit with your arm, hand, and all five fingers pointed straight up. Make sure to keep your wrist straight. Gently bend your fingers at the big knuckle, where your fingers meet your hand, as far down as you can. Keep the small knuckles in your fingers straight. Think of forming a tabletop with your fingers. Hold this position for 10 seconds. Extend your fingers back to your starting position, stretching every joint  fully. Repeat this exercise 5-10 times with each hand. Finger spread  Place your hand flat on a table with your palm facing down. Make sure your wrist stays straight. Spread your fingers and thumb apart from each other as far as you can until you feel a gentle stretch. Hold this position for 10 seconds. Bring your fingers and thumb tight together again. Hold this position for 10 seconds. Repeat this exercise 5-10 times with each hand. Making circles  Stand or sit with your arm, hand, and all five fingers pointed straight up. Make sure to keep your wrist straight. Make a circle by  touching the tip of your thumb to the tip of your index finger. Hold for 10 seconds. Then open your hand wide. Repeat this motion with your thumb and each of your fingers. Repeat this exercise 5-10 times with each hand. Thumb motion  Sit with your forearm resting on a table and your wrist straight. Your thumb should be facing up toward the ceiling. Keep your fingers relaxed as you move your thumb. Lift your thumb up as high as you can toward the ceiling. Hold for 10 seconds. Bend your thumb across your palm as far as you can, reaching the tip of your thumb for the small finger (pinkie) side of your palm. Hold for 10 seconds. Repeat this exercise 5-10 times with each hand. Grip strengthening  Hold a stress ball or other soft ball in the middle of your hand. Slowly increase the pressure, squeezing the ball as much as you can without causing pain. Think of bringing the tips of your fingers into the middle of your palm. All of your finger joints should bend when doing this exercise. Hold your squeeze for 10 seconds, then relax. Repeat this exercise 5-10 times with each hand. Contact a health care provider if: Your hand pain or discomfort gets much worse when you do an exercise. Your hand pain or discomfort does not improve within 2 hours after you exercise. If you have either of these problems, stop doing these  exercises right away. Do not do them again unless your provider says that you can. Get help right away if: You develop sudden, severe hand pain or swelling. If this happens, stop doing these exercises right away. Do not do them again unless your provider says that you can. This information is not intended to replace advice given to you by your health care provider. Make sure you discuss any questions you have with your health care provider. Document Revised: 05/26/2022 Document Reviewed: 05/26/2022 Elsevier Patient Education  2024 [employer].

## 2023-08-25 ENCOUNTER — Ambulatory Visit (INDEPENDENT_AMBULATORY_CARE_PROVIDER_SITE_OTHER)

## 2023-08-25 ENCOUNTER — Ambulatory Visit: Admitting: Sports Medicine

## 2023-08-25 VITALS — BP 100/60 | HR 113 | Ht 65.25 in | Wt 143.0 lb

## 2023-08-25 DIAGNOSIS — M25572 Pain in left ankle and joints of left foot: Secondary | ICD-10-CM

## 2023-08-25 DIAGNOSIS — S93402A Sprain of unspecified ligament of left ankle, initial encounter: Secondary | ICD-10-CM

## 2023-08-25 MED ORDER — MELOXICAM 15 MG PO TABS: 15.0000 mg | ORAL_TABLET | Freq: Every day | ORAL | 0 refills | Status: AC

## 2023-08-25 NOTE — Progress Notes (Signed)
 Betty Huber D.[facility] [address] [place] [address] Phone: [phone]   Assessment and Plan:     1. Acute left ankle pain 2. Inversion sprain of ankle, left, initial encounter  -Acute, initial sports medicine visit - Most consistent with inversion ankle sprain of left ankle occurring 10 days ago with mild improvement with conservative measures - X-ray obtained in clinic.  My interpretation: No acute fracture or dislocation.  - Start HEP for ankle inversion injury and overall ankle strengthening - Start meloxicam 15 mg daily x2 weeks.  If still having pain after 2 weeks, complete 3rd-week of NSAID. May use remaining NSAID as needed once daily for pain control.  Do not to use additional over-the-counter NSAIDs (ibuprofen, naproxen, Advil, Aleve, etc.) while taking prescription NSAIDs.  May use Tylenol [phone] mg 2 to 3 times a day for breakthrough pain. - Recommend 1 week of relative rest and then gradual reintroduction of physical activity as tolerated  15 additional minutes spent for educating Therapeutic Home Exercise Program.  This included exercises focusing on stretching, strengthening, with focus on eccentric aspects.   Long term goals include an improvement in range of motion, strength, endurance as well as avoiding reinjury. Patient's frequency would include in 1-2 times a day, 3-5 times a week for a duration of 6-12 weeks. Proper technique shown and discussed handout in great detail with ATC.  All questions were discussed and answered.    Pertinent previous records reviewed include none  Follow Up: As needed if no improvement in 3 to 4 weeks for reevaluation.  Could consider ultrasound versus physical therapy   Subjective:   I, Betty Huber am a scribe for Dr. Jean Huber.    Chief Complaint: left ankle pain   HPI:   08/25/23 Patient is a 58 year old female with left ankle pain. Patient states was walking in a parking lot and  did not notice a step down and the ankle twisted. Yesterday there was a sharp pain around the ankle area. There is a new bruise on her ankle. Walked 25 minutes on Monday and the ankle and it started hurting. She usually walks an hour a day. Tried to exercises that she found online.   Duration? About a week in a half ago Did you have an Injury to cause this pain? yes Taking Medication for pain? Aleve but nothing currently Numbness or Tingling?no  Does the pain Radiate? Yes up the calf Altered gait or use? yes ROM/ impairment of movement? Yes External rotation hurts more   Relevant Historical Information: None pertinent  Additional pertinent review of systems negative.   Current Outpatient Medications:    Cholecalciferol 25 MCG (1000 UT) capsule, Take by mouth., Disp: , Rfl:    loratadine (CLARITIN) 10 MG tablet, Take by mouth as needed., Disp: , Rfl:    Magnesium Citrate 100 MG TABS, 100 mg Once Daily., Disp: , Rfl:    meloxicam (MOBIC) 15 MG tablet, Take 1 tablet (15 mg total) by mouth daily., Disp: 30 tablet, Rfl: 0   Objective:     Vitals:   08/25/23 1248  BP: 100/60  Pulse: (!) 113  SpO2: 98%  Weight: 143 lb (64.9 kg)  Height: 5' 5.25" (1.657 m)      Body mass index is 23.61 kg/m.    Physical Exam:    Gen: Appears well, nad, nontoxic and pleasant Psych: Alert and oriented, appropriate mood and affect Neuro: sensation intact, strength is 5/5 with  df/pf/inv/ev, muscle tone wnl Skin: no susupicious lesions or rashes  Left foot/ankle:  No deformity, Mild edema along lateral malleolus.  Bruising distal to lateral malleolus and around medial malleolus TTP ATFL, CFL, deltoid NTTP over fibular head, lat mal, medial mal, achilles, navicular, base of 5th,   calcaneous or midfoot ROM DF 30, PF 45, inv/ev painful but intact Negative ant drawer, talar tilt, rotation test, squeeze test. Neg thompson Mild pain with resisted inversion or eversion    Electronically signed by:   Betty Huber D.[facility] 1:44 PM 08/25/23

## 2023-08-25 NOTE — Patient Instructions (Addendum)
 Ankle HEP.  - Start meloxicam 15 mg daily x2 weeks.  If still having pain after 2 weeks, complete 3rd-week of NSAID. May use remaining NSAID as needed once daily for pain control.  Do not to use additional over-the-counter NSAIDs (ibuprofen, naproxen, Advil, Aleve, etc.) while taking prescription NSAIDs.  May use Tylenol [phone] mg 2 to 3 times a day for breakthrough pain.  1 week of rest then gradual increase in activity.  Follow up in 4 weeks as needed.

## 2023-09-06 ENCOUNTER — Encounter: Payer: Self-pay | Admitting: Sports Medicine

## 2024-03-30 ENCOUNTER — Encounter: Payer: BC Managed Care – PPO | Admitting: Nurse Practitioner

## 2024-05-02 ENCOUNTER — Telehealth: Payer: Self-pay

## 2024-05-02 NOTE — Telephone Encounter (Signed)
 Attempted to reach patient concerning colonoscopy recall; unable to speak with patient;  left message and number to the office for patient to call back and schedule appts;

## 2024-05-04 ENCOUNTER — Ambulatory Visit (HOSPITAL_BASED_OUTPATIENT_CLINIC_OR_DEPARTMENT_OTHER): Payer: Self-pay | Admitting: Obstetrics & Gynecology

## 2024-05-17 NOTE — Telephone Encounter (Signed)
 Attempted to reach patient concerning colonoscopy recall; unable to speak with patient;  left message and number to the office for patient to call back and schedule appts;

## 2024-07-25 ENCOUNTER — Ambulatory Visit: Admitting: Rheumatology

## 2024-08-03 ENCOUNTER — Ambulatory Visit (HOSPITAL_BASED_OUTPATIENT_CLINIC_OR_DEPARTMENT_OTHER): Admitting: Obstetrics & Gynecology
